# Patient Record
Sex: Male | Born: 1975 | Race: White | Hispanic: No
Health system: Midwestern US, Community
[De-identification: ages and names within clinical notes are randomized; demographics above are authoritative.]

## PROBLEM LIST (undated history)

## (undated) DIAGNOSIS — R748 Abnormal levels of other serum enzymes: Secondary | ICD-10-CM

---

## 2011-07-26 MED ORDER — HYOSCYAMINE SULFATE ER 0.375 MG PO TB12
375 MCG | ORAL_TABLET | ORAL | Status: DC
Start: 2011-07-26 — End: 2016-06-20

## 2011-07-26 MED ORDER — MIRTAZAPINE 15 MG PO TABS
15 MG | ORAL_TABLET | ORAL | Status: DC
Start: 2011-07-26 — End: 2016-06-20

## 2011-07-26 MED ORDER — MELATONIN 3 MG PO TABS
3 MG | ORAL_TABLET | ORAL | Status: DC
Start: 2011-07-26 — End: 2016-06-20

## 2011-07-26 NOTE — Progress Notes (Signed)
Subjective:      Patient ID: Vincent Thornton is a 36 y.o. male, a new patient, who comes in complaining of depression.    HPI He is having trouble sleeping, partly because of the pain in his back and ankles. He is also complaining of diarrhea for several months, on and off. There is no blood. It all seem to start after he had a stomach "flu". His mother has IBS. His pain is no worse with meals. He has pain, a pressure in his left abdomen.      He is 35, single , disabled, with 2 small children, one of whom stays with him most of the time. He is disabled from a crush injury to his ankles and he was on Oxycontin. He is an ex-smoker, but does use dip. He rarely uses alcohol.     He says he is really having trouble falling asleep. He is tired.    Vincent Thornton is a 36 y.o. male with the following history as recorded in [facility]:  Patient Active Problem List    Diagnosis Date Noted   . Anxiety      Current Outpatient Prescriptions   Medication Sig Dispense Refill   . mirtazapine (REMERON) 15 MG tablet 1 tab po q HS for sleep and depression  30 tablet  1   . Melatonin (RA MELATONIN) 3 MG TABS 1 tab po q HS for sleep  100 tablet  3   . hyoscyamine (LEVBID) 0.375 MG CR tablet 1 tab po BID for IBS  60 tablet  3     No current facility-administered medications for this visit.     Allergies: Review of patient's allergies indicates no known allergies.  Past Medical History   Diagnosis Date   . Anxiety      Past Surgical History   Procedure Laterality Date   . Ankle surgery Bilateral      History reviewed. No pertinent family history.  History   Substance Use Topics   . Smoking status: Former Smoker -- 1.00 packs/day     Quit date: 06/25/2011   . Smokeless tobacco: Current User   . Alcohol Use: Yes      occ         Review of Systems   Constitutional: Positive for fatigue.   HENT: Negative.    Eyes: Negative.    Respiratory: Negative.    Cardiovascular: Negative.    Gastrointestinal: Negative.    Genitourinary: Negative for  dysuria.   Musculoskeletal: Positive for back pain.   Neurological: Negative for tremors and weakness.   Psychiatric/Behavioral: Positive for dysphoric mood. The patient is nervous/anxious.        Objective:   Physical Exam   Vitals reviewed.  Constitutional: He is oriented to person, place, and time. He appears well-developed and well-nourished.   HENT:   Head: Normocephalic.   Right Ear: Tympanic membrane, external ear and ear canal normal.   Left Ear: Tympanic membrane, external ear and ear canal normal.   Mouth/Throat: Oropharynx is clear and moist.   Eyes: Conjunctivae and EOM are normal. Pupils are equal, round, and reactive to light.   Neck: Normal range of motion. Neck supple. No JVD present. Carotid bruit is not present. No thyromegaly present.   Cardiovascular: Normal rate, regular rhythm, normal heart sounds and intact distal pulses.    No murmur heard.  Pulmonary/Chest: Effort normal and breath sounds normal. No respiratory distress.   Abdominal: Soft. Bowel sounds are normal. There is no  tenderness.   Musculoskeletal: Normal range of motion. He exhibits no edema.   Lymphadenopathy:     He has no cervical adenopathy.   Neurological: He is alert and oriented to person, place, and time. He has normal reflexes.   Skin: Skin is warm, dry and intact.   Psychiatric: He has a normal mood and affect. His speech is normal and behavior is normal. Judgment and thought content normal. Cognition and memory are normal.   BP 130/86  Pulse 68  Temp(Src) 97.9 F (36.6 C) (Oral)  Resp 16  Wt 179 lb 3.2 oz (81.285 kg)      Assessment:      1. Insomnia    2. Anxiety    3. IBS (irritable bowel syndrome)            Plan:      MEDICATIONS:  Orders Placed This Encounter   Medications   . mirtazapine (REMERON) 15 MG tablet     Sig: 1 tab po q HS for sleep and depression     Dispense:  30 tablet     Refill:  1   . Melatonin (RA MELATONIN) 3 MG TABS     Sig: 1 tab po q HS for sleep     Dispense:  100 tablet     Refill:  3      Whatever amount there is in an OTC bottle   . hyoscyamine (LEVBID) 0.375 MG CR tablet     Sig: 1 tab po BID for IBS     Dispense:  60 tablet     Refill:  3       ORDERS:  No orders of the defined types were placed in this encounter.       Follow-up:  Return in about 4 weeks (around 08/23/2011) for recheck of sleep and stomach.    PATIENT INSTRUCTIONS:  Patient Instructions         We are committed to providing you with the best care possible.   In order to help [place] achieve these goals please remember to bring all medications, herbal products, and over the counter supplements with you to each visit.     If your provider has ordered testing for you, please be sure to follow up with our office if you have not received results within 7 days after the testing took place.     *If you receive a survey after visiting one of our offices, please take time to share your experience concerning your physician office visit. These surveys are confidential and no health information about you is shared.  We are eager to improve for you and we are counting on your feedback to help make that happen.              Insomnia: After Your Visit  Your Care Instructions  Insomnia is the inability to sleep well. It is a common problem for most people at some time. Insomnia may make it hard for you to get to sleep, stay asleep, or sleep as long as you need to. This can make you tired and grouchy during the day. It can also make you forgetful, less effective at work, and unhappy.  Insomnia can be caused by conditions such as depression or anxiety. Pain can also affect your ability to sleep. When these problems are solved, the insomnia usually clears up. But sometimes bad sleep habits can cause insomnia.  If insomnia is affecting your work or your enjoyment of life, you can  take steps to improve your sleep.  Follow-up care is a key part of your treatment and safety. Be sure to make and go to all appointments, and call your doctor if you are having  problems. It's also a good idea to know your test results and keep a list of the medicines you take.  How can you care for yourself at home?  What to avoid   Do not have drinks with caffeine, such as coffee or black tea, for 8 hours before bed.   Do not smoke or use other types of tobacco near bedtime. Nicotine is a stimulant and can keep you awake.   Avoid drinking alcohol late in the evening, because it can cause you to wake in the middle of the night.   Do not eat a big meal close to bedtime. If you are hungry, eat a light snack.   Do not drink a lot of water close to bedtime, because the need to urinate may wake you up during the night.   Do not read or watch TV in bed. Use the bed only for sleeping and sexual activity.  What to try   Go to bed at the same time every night, and wake up at the same time every morning. Do not take naps during the day.   Keep your bedroom quiet, dark, and cool.   Get regular exercise, but not within 3 to 4 hours of your bedtime.   Sleep on a comfortable pillow and mattress.   If watching the clock makes you anxious, turn it facing away from you so you cannot see the time.   If you worry when you lie down, start a worry book. Well before bedtime, write down your worries, and then set the book and your concerns aside.   Try meditation or other relaxation techniques before you go to bed.   If you cannot fall asleep, get up and go to another room until you feel sleepy. Do something relaxing. Repeat your bedtime routine before you go to bed again.   Make your house quiet and calm about an hour before bedtime. Turn down the lights, turn off the TV, log off the computer, and turn down the volume on music. This can help you relax after a busy day.  When should you call for help?  Watch closely for changes in your health, and be sure to contact your doctor if:   Your efforts to improve your sleep do not work.   Your insomnia gets worse.   You have been feeling down,  depressed, or hopeless or have lost interest in things that you usually enjoy.    Where can you learn more?    Go to [email or web address] and sign in to your MyChart account.    Enter P513 in the Search Health Information box to learn more about "Insomnia: After Your Visit."    If you do not have an account, please click on the "Sign Up Now" link.     2006-2012 [employer]. Care instructions adapted under license by [facility]. This care instruction is for use with your licensed healthcare professional. If you have questions about a medical condition or this instruction, always ask your healthcare professional. [employer] disclaims any warranty or liability for your use of this information.  Content Version: 9.4.94723; Last Revised: May 17, 2010                Irritable Bowel Syndrome: After Your Visit  Your  Care Instructions  Irritable bowel syndrome, or IBS, is a problem with the intestines that causes belly pain, bloating, gas, constipation, and diarrhea. The cause of IBS is not well known. IBS can last for many years, but it does not get worse over time or lead to serious disease.  Most people can control their symptoms by changing their diet and reducing stress.  Follow-up care is a key part of your treatment and safety. Be sure to make and go to all appointments, and call your doctor if you are having problems. It's also a good idea to know your test results and keep a list of the medicines you take.  How can you care for yourself at home?   For constipation:   Include fruits, vegetables, beans, and whole grains in your diet each day. These foods are high in fiber.   Drink plenty of fluids, enough so that your urine is light yellow or clear like water. If you have kidney, heart, or liver disease and have to limit fluids, talk with your doctor before you increase the amount of fluids you drink.   Get some exercise every day. Build up slowly to  30 to 60 minutes a day on 5 or more days of the week.   Take a fiber supplement, such as Citrucel or Metamucil, every day if needed. Start with a small dose and very slowly increase the dose over a month or more.   Schedule time each day for a bowel movement. Having a daily routine may help. Take your time and do not strain when having a bowel movement.   If you often have diarrhea, limit foods and drinks that make it worse. These are different for each person but may include caffeine (found in coffee, tea, chocolate, and cola drinks), alcohol, fatty foods, gas-producing foods (such as beans, cabbage, and broccoli), some dairy products, and spicy foods. Do not eat candy or gum that contains sorbitol.   Keep a daily diary of what you eat and what symptoms you have. This may help find foods that cause you problems.   Eat slowly. Try to make mealtime relaxing.   Find ways to reduce stress.   Get at least 30 minutes of exercise on most days of the week. Exercise can help reduce tension and prevent constipation. Walking is a good choice. You also may want to do other activities, such as running, swimming, cycling, or playing tennis or team sports.  When should you call for help?  Call your doctor now or seek immediate medical care if:   Your pain is different than usual or occurs with fever.   You lose weight without trying, or you lose your appetite and you do not know why.   Your symptoms often wake you from sleep.   Your stools are black and tarlike or have streaks of blood.  Watch closely for changes in your health, and be sure to contact your doctor if:   Your IBS symptoms get worse or begin to disrupt your day-to-day life.   You become more tired than usual.   Your home treatment stops working.    Where can you learn more?    Go to [email or web address] and sign in to your MyChart account.    Enter [phone] in the Search Health Information box to learn more about "Irritable Bowel  Syndrome: After Your Visit."    If you do not have an account, please click on the "Sign Up Now" link.  2006-2012 [employer]. Care instructions adapted under license by [facility]. This care instruction is for use with your licensed healthcare professional. If you have questions about a medical condition or this instruction, always ask your healthcare professional. [employer] disclaims any warranty or liability for your use of this information.  Content Version: 9.4.94723; Last Revised: August 30, 2009

## 2011-07-26 NOTE — Patient Instructions (Signed)
We are committed to providing you with the best care possible.   In order to help [place] achieve these goals please remember to bring all medications, herbal products, and over the counter supplements with you to each visit.     If your provider has ordered testing for you, please be sure to follow up with our office if you have not received results within 7 days after the testing took place.     *If you receive a survey after visiting one of our offices, please take time to share your experience concerning your physician office visit. These surveys are confidential and no health information about you is shared.  We are eager to improve for you and we are counting on your feedback to help make that happen.              Insomnia: After Your Visit  Your Care Instructions  Insomnia is the inability to sleep well. It is a common problem for most people at some time. Insomnia may make it hard for you to get to sleep, stay asleep, or sleep as long as you need to. This can make you tired and grouchy during the day. It can also make you forgetful, less effective at work, and unhappy.  Insomnia can be caused by conditions such as depression or anxiety. Pain can also affect your ability to sleep. When these problems are solved, the insomnia usually clears up. But sometimes bad sleep habits can cause insomnia.  If insomnia is affecting your work or your enjoyment of life, you can take steps to improve your sleep.  Follow-up care is a key part of your treatment and safety. Be sure to make and go to all appointments, and call your doctor if you are having problems. It's also a good idea to know your test results and keep a list of the medicines you take.  How can you care for yourself at home?  What to avoid   Do not have drinks with caffeine, such as coffee or black tea, for 8 hours before bed.   Do not smoke or use other types of tobacco near bedtime. Nicotine is a stimulant and can keep you awake.   Avoid drinking alcohol  late in the evening, because it can cause you to wake in the middle of the night.   Do not eat a big meal close to bedtime. If you are hungry, eat a light snack.   Do not drink a lot of water close to bedtime, because the need to urinate may wake you up during the night.   Do not read or watch TV in bed. Use the bed only for sleeping and sexual activity.  What to try   Go to bed at the same time every night, and wake up at the same time every morning. Do not take naps during the day.   Keep your bedroom quiet, dark, and cool.   Get regular exercise, but not within 3 to 4 hours of your bedtime.   Sleep on a comfortable pillow and mattress.   If watching the clock makes you anxious, turn it facing away from you so you cannot see the time.   If you worry when you lie down, start a worry book. Well before bedtime, write down your worries, and then set the book and your concerns aside.   Try meditation or other relaxation techniques before you go to bed.   If you cannot fall asleep, get up and go  to another room until you feel sleepy. Do something relaxing. Repeat your bedtime routine before you go to bed again.   Make your house quiet and calm about an hour before bedtime. Turn down the lights, turn off the TV, log off the computer, and turn down the volume on music. This can help you relax after a busy day.  When should you call for help?  Watch closely for changes in your health, and be sure to contact your doctor if:   Your efforts to improve your sleep do not work.   Your insomnia gets worse.   You have been feeling down, depressed, or hopeless or have lost interest in things that you usually enjoy.    Where can you learn more?    Go to [email or web address] and sign in to your MyChart account.    Enter P513 in the Search Health Information box to learn more about "Insomnia: After Your Visit."    If you do not have an account, please click on the "Sign Up Now" link.     2006-2012  [employer]. Care instructions adapted under license by [facility]. This care instruction is for use with your licensed healthcare professional. If you have questions about a medical condition or this instruction, always ask your healthcare professional. [employer] disclaims any warranty or liability for your use of this information.  Content Version: 9.4.94723; Last Revised: May 17, 2010                Irritable Bowel Syndrome: After Your Visit  Your Care Instructions  Irritable bowel syndrome, or IBS, is a problem with the intestines that causes belly pain, bloating, gas, constipation, and diarrhea. The cause of IBS is not well known. IBS can last for many years, but it does not get worse over time or lead to serious disease.  Most people can control their symptoms by changing their diet and reducing stress.  Follow-up care is a key part of your treatment and safety. Be sure to make and go to all appointments, and call your doctor if you are having problems. It's also a good idea to know your test results and keep a list of the medicines you take.  How can you care for yourself at home?   For constipation:   Include fruits, vegetables, beans, and whole grains in your diet each day. These foods are high in fiber.   Drink plenty of fluids, enough so that your urine is light yellow or clear like water. If you have kidney, heart, or liver disease and have to limit fluids, talk with your doctor before you increase the amount of fluids you drink.   Get some exercise every day. Build up slowly to 30 to 60 minutes a day on 5 or more days of the week.   Take a fiber supplement, such as Citrucel or Metamucil, every day if needed. Start with a small dose and very slowly increase the dose over a month or more.   Schedule time each day for a bowel movement. Having a daily routine may help. Take your time and do not strain when having a bowel movement.   If you often have  diarrhea, limit foods and drinks that make it worse. These are different for each person but may include caffeine (found in coffee, tea, chocolate, and cola drinks), alcohol, fatty foods, gas-producing foods (such as beans, cabbage, and broccoli), some dairy products, and spicy foods. Do not eat candy or gum that  contains sorbitol.   Keep a daily diary of what you eat and what symptoms you have. This may help find foods that cause you problems.   Eat slowly. Try to make mealtime relaxing.   Find ways to reduce stress.   Get at least 30 minutes of exercise on most days of the week. Exercise can help reduce tension and prevent constipation. Walking is a good choice. You also may want to do other activities, such as running, swimming, cycling, or playing tennis or team sports.  When should you call for help?  Call your doctor now or seek immediate medical care if:   Your pain is different than usual or occurs with fever.   You lose weight without trying, or you lose your appetite and you do not know why.   Your symptoms often wake you from sleep.   Your stools are black and tarlike or have streaks of blood.  Watch closely for changes in your health, and be sure to contact your doctor if:   Your IBS symptoms get worse or begin to disrupt your day-to-day life.   You become more tired than usual.   Your home treatment stops working.    Where can you learn more?    Go to [email or web address] and sign in to your MyChart account.    Enter [phone] in the Search Health Information box to learn more about "Irritable Bowel Syndrome: After Your Visit."    If you do not have an account, please click on the "Sign Up Now" link.     2006-2012 [employer]. Care instructions adapted under license by [facility]. This care instruction is for use with your licensed healthcare professional. If you have questions about a medical condition or this instruction, always ask your healthcare  professional. [employer] disclaims any warranty or liability for your use of this information.  Content Version: 9.4.94723; Last Revised: August 30, 2009

## 2016-02-05 ENCOUNTER — Observation Stay (HOSPITAL_COMMUNITY)
Admission: EM | Admit: 2016-02-05 | Discharge: 2016-02-06 | Disposition: A | Discharge status: Home / Self Care | Payer: 59 | Attending: General Surgery | Admitting: General Surgery

## 2016-02-05 ENCOUNTER — Emergency Department (HOSPITAL_COMMUNITY): Payer: 59 | Admitting: Certified Registered Nurse Anesthetist

## 2016-02-05 ENCOUNTER — Encounter (HOSPITAL_COMMUNITY)
Admission: EM | Disposition: A | Discharge status: Home / Self Care | Payer: Self-pay | Source: Home / Self Care | Attending: Emergency Medicine

## 2016-02-05 ENCOUNTER — Emergency Department (HOSPITAL_COMMUNITY): Payer: 59

## 2016-02-05 ENCOUNTER — Encounter (HOSPITAL_COMMUNITY): Payer: Self-pay

## 2016-02-05 DIAGNOSIS — S1191XA Laceration without foreign body of unspecified part of neck, initial encounter: Secondary | ICD-10-CM | POA: Diagnosis not present

## 2016-02-05 DIAGNOSIS — S199XXA Unspecified injury of neck, initial encounter: Secondary | ICD-10-CM

## 2016-02-05 DIAGNOSIS — W293XXA Contact with powered garden and outdoor hand tools and machinery, initial encounter: Secondary | ICD-10-CM | POA: Insufficient documentation

## 2016-02-05 DIAGNOSIS — S21119A Laceration without foreign body of unspecified front wall of thorax without penetration into thoracic cavity, initial encounter: Secondary | ICD-10-CM

## 2016-02-05 DIAGNOSIS — F17200 Nicotine dependence, unspecified, uncomplicated: Secondary | ICD-10-CM | POA: Insufficient documentation

## 2016-02-05 DIAGNOSIS — S2191XA Laceration without foreign body of unspecified part of thorax, initial encounter: Secondary | ICD-10-CM | POA: Diagnosis present

## 2016-02-05 HISTORY — PX: I & D EXTREMITY: SHX5045

## 2016-02-05 LAB — COMPREHENSIVE METABOLIC PANEL
ALK PHOS: 57 U/L (ref 38–126)
ALT: 18 U/L (ref 17–63)
ANION GAP: 7 (ref 5–15)
AST: 26 U/L (ref 15–41)
Albumin: 4.2 g/dL (ref 3.5–5.0)
BILIRUBIN TOTAL: 1.3 mg/dL — AB (ref 0.3–1.2)
BUN: 15 mg/dL (ref 6–20)
CALCIUM: 9.3 mg/dL (ref 8.9–10.3)
CO2: 23 mmol/L (ref 22–32)
Chloride: 108 mmol/L (ref 101–111)
Creatinine, Ser: 1.04 mg/dL (ref 0.61–1.24)
Glucose, Bld: 98 mg/dL (ref 65–99)
Potassium: 3.8 mmol/L (ref 3.5–5.1)
Sodium: 138 mmol/L (ref 135–145)
TOTAL PROTEIN: 7.3 g/dL (ref 6.5–8.1)

## 2016-02-05 LAB — URINALYSIS, ROUTINE W REFLEX MICROSCOPIC
Bilirubin Urine: NEGATIVE
Glucose, UA: NEGATIVE mg/dL
HGB URINE DIPSTICK: NEGATIVE
Ketones, ur: NEGATIVE mg/dL
Leukocytes, UA: NEGATIVE
NITRITE: NEGATIVE
PROTEIN: NEGATIVE mg/dL
Specific Gravity, Urine: >1.046 — ABNORMAL HIGH (ref 1.005–1.030)
pH: 6 (ref 5.0–8.0)

## 2016-02-05 LAB — PREPARE FRESH FROZEN PLASMA
Unit division: 0
Unit division: 0

## 2016-02-05 LAB — TYPE AND SCREEN
ABO/RH(D): A POS
Antibody Screen: NEGATIVE
UNIT DIVISION: 0
Unit division: 0

## 2016-02-05 LAB — I-STAT CHEM 8, ED
BUN: 18 mg/dL (ref 6–20)
CALCIUM ION: 1.11 mmol/L — AB (ref 1.13–1.30)
CHLORIDE: 104 mmol/L (ref 101–111)
Creatinine, Ser: 0.9 mg/dL (ref 0.61–1.24)
GLUCOSE: 95 mg/dL (ref 65–99)
HCT: 45 % (ref 39.0–52.0)
Hemoglobin: 15.3 g/dL (ref 13.0–17.0)
Potassium: 3.9 mmol/L (ref 3.5–5.1)
Sodium: 142 mmol/L (ref 135–145)
TCO2: 23 mmol/L (ref 0–100)

## 2016-02-05 LAB — CBC
HCT: 43.9 % (ref 39.0–52.0)
HEMOGLOBIN: 14.5 g/dL (ref 13.0–17.0)
MCH: 30.7 pg (ref 26.0–34.0)
MCHC: 33 g/dL (ref 30.0–36.0)
MCV: 93 fL (ref 78.0–100.0)
Platelets: 214 10*3/uL (ref 150–400)
RBC: 4.72 MIL/uL (ref 4.22–5.81)
RDW: 12.1 % (ref 11.5–15.5)
WBC: 6.3 10*3/uL (ref 4.0–10.5)

## 2016-02-05 LAB — PROTIME-INR
INR: 1.01
PROTHROMBIN TIME: 13.3 s (ref 11.4–15.2)

## 2016-02-05 LAB — CDS SEROLOGY

## 2016-02-05 LAB — ETHANOL

## 2016-02-05 LAB — I-STAT CG4 LACTIC ACID, ED: LACTIC ACID, VENOUS: 1.05 mmol/L (ref 0.5–1.9)

## 2016-02-05 LAB — ABO/RH: ABO/RH(D): A POS

## 2016-02-05 SURGERY — IRRIGATION AND DEBRIDEMENT EXTREMITY
Anesthesia: General | Laterality: Left

## 2016-02-05 MED ORDER — SUCCINYLCHOLINE CHLORIDE 200 MG/10ML IV SOSY
PREFILLED_SYRINGE | INTRAVENOUS | Status: AC
  Filled 2016-02-05: qty 10

## 2016-02-05 MED ORDER — ONDANSETRON HCL 4 MG/2ML IJ SOLN
INTRAMUSCULAR | Status: AC
  Filled 2016-02-05: qty 2

## 2016-02-05 MED ORDER — SUGAMMADEX SODIUM 200 MG/2ML IV SOLN
INTRAVENOUS | Status: AC
  Filled 2016-02-05: qty 2

## 2016-02-05 MED ORDER — ENOXAPARIN SODIUM 40 MG/0.4ML ~~LOC~~ SOLN
40.0000 mg | SUBCUTANEOUS | Status: DC
  Administered 2016-02-05: 40 mg via SUBCUTANEOUS
  Filled 2016-02-05: qty 0.4

## 2016-02-05 MED ORDER — MIDAZOLAM HCL 5 MG/5ML IJ SOLN
INTRAMUSCULAR | Status: DC | PRN
  Administered 2016-02-05: 2 mg via INTRAVENOUS

## 2016-02-05 MED ORDER — 0.9 % SODIUM CHLORIDE (POUR BTL) OPTIME
TOPICAL | Status: DC | PRN
  Administered 2016-02-05: 1000 mL

## 2016-02-05 MED ORDER — MEPERIDINE HCL 25 MG/ML IJ SOLN
6.2500 mg | INTRAMUSCULAR | Status: DC | PRN
Start: 2016-02-05 — End: 2016-02-06

## 2016-02-05 MED ORDER — PROPOFOL 10 MG/ML IV BOLUS
INTRAVENOUS | Status: DC | PRN
  Administered 2016-02-05: 160 mg via INTRAVENOUS

## 2016-02-05 MED ORDER — LIDOCAINE 2% (20 MG/ML) 5 ML SYRINGE
INTRAMUSCULAR | Status: DC | PRN
  Administered 2016-02-05: 100 mg via INTRAVENOUS

## 2016-02-05 MED ORDER — LACTATED RINGERS IV SOLN
INTRAVENOUS | Status: DC | PRN
  Administered 2016-02-05: 10:00:00 via INTRAVENOUS

## 2016-02-05 MED ORDER — HYDROMORPHONE HCL 1 MG/ML IJ SOLN: 0.2500 mg | INTRAMUSCULAR | Status: DC | PRN

## 2016-02-05 MED ORDER — TETANUS-DIPHTH-ACELL PERTUSSIS 5-2.5-18.5 LF-MCG/0.5 IM SUSP
0.5000 mL | Freq: Once | INTRAMUSCULAR | Status: AC
  Administered 2016-02-05: 0.5 mL via INTRAMUSCULAR
  Filled 2016-02-05 (×2): qty 0.5

## 2016-02-05 MED ORDER — PROPOFOL 10 MG/ML IV BOLUS
INTRAVENOUS | Status: AC
  Filled 2016-02-05: qty 20

## 2016-02-05 MED ORDER — KETOROLAC TROMETHAMINE 30 MG/ML IJ SOLN
INTRAMUSCULAR | Status: AC
  Filled 2016-02-05: qty 1

## 2016-02-05 MED ORDER — FENTANYL CITRATE (PF) 100 MCG/2ML IJ SOLN
INTRAMUSCULAR | Status: AC
  Filled 2016-02-05: qty 2

## 2016-02-05 MED ORDER — NAPROXEN 250 MG PO TABS
500.0000 mg | ORAL_TABLET | Freq: Two times a day (BID) | ORAL | Status: DC
  Administered 2016-02-05 – 2016-02-06 (×3): 500 mg via ORAL
  Filled 2016-02-05 (×3): qty 2

## 2016-02-05 MED ORDER — CEFAZOLIN SODIUM-DEXTROSE 2-4 GM/100ML-% IV SOLN
2.0000 g | Freq: Once | INTRAVENOUS | Status: AC
  Administered 2016-02-05: 2 g via INTRAVENOUS
  Filled 2016-02-05: qty 100

## 2016-02-05 MED ORDER — ONDANSETRON HCL 4 MG PO TABS
4.0000 mg | ORAL_TABLET | Freq: Four times a day (QID) | ORAL | Status: DC | PRN
Start: 2016-02-05 — End: 2016-02-06

## 2016-02-05 MED ORDER — ROCURONIUM BROMIDE 10 MG/ML (PF) SYRINGE
PREFILLED_SYRINGE | INTRAVENOUS | Status: DC | PRN
  Administered 2016-02-05: 40 mg via INTRAVENOUS

## 2016-02-05 MED ORDER — ONDANSETRON HCL 4 MG/2ML IJ SOLN: 4.0000 mg | Freq: Four times a day (QID) | INTRAMUSCULAR | Status: DC | PRN

## 2016-02-05 MED ORDER — SUCCINYLCHOLINE CHLORIDE 200 MG/10ML IV SOSY
PREFILLED_SYRINGE | INTRAVENOUS | Status: DC | PRN
  Administered 2016-02-05: 120 mg via INTRAVENOUS

## 2016-02-05 MED ORDER — LIDOCAINE 2% (20 MG/ML) 5 ML SYRINGE
INTRAMUSCULAR | Status: AC
  Filled 2016-02-05: qty 5

## 2016-02-05 MED ORDER — FENTANYL CITRATE (PF) 100 MCG/2ML IJ SOLN
INTRAMUSCULAR | Status: DC | PRN
  Administered 2016-02-05: 100 ug via INTRAVENOUS

## 2016-02-05 MED ORDER — MIDAZOLAM HCL 2 MG/2ML IJ SOLN
INTRAMUSCULAR | Status: AC
  Filled 2016-02-05: qty 2

## 2016-02-05 MED ORDER — ONDANSETRON HCL 4 MG/2ML IJ SOLN
INTRAMUSCULAR | Status: DC | PRN
  Administered 2016-02-05: 4 mg via INTRAVENOUS

## 2016-02-05 MED ORDER — SUGAMMADEX SODIUM 200 MG/2ML IV SOLN
INTRAVENOUS | Status: DC | PRN
  Administered 2016-02-05: 200 mg via INTRAVENOUS

## 2016-02-05 MED ORDER — TRAMADOL HCL 50 MG PO TABS
ORAL_TABLET | ORAL | Status: AC
  Filled 2016-02-05: qty 2

## 2016-02-05 MED ORDER — IOPAMIDOL (ISOVUE-370) INJECTION 76%
100.0000 mL | Freq: Once | INTRAVENOUS | Status: AC | PRN
  Administered 2016-02-05: 100 mL via INTRAVENOUS

## 2016-02-05 MED ORDER — PROMETHAZINE HCL 25 MG/ML IJ SOLN: 6.2500 mg | INTRAMUSCULAR | Status: DC | PRN

## 2016-02-05 MED ORDER — ROCURONIUM BROMIDE 10 MG/ML (PF) SYRINGE
PREFILLED_SYRINGE | INTRAVENOUS | Status: AC
  Filled 2016-02-05: qty 10

## 2016-02-05 MED ORDER — ACETAMINOPHEN 325 MG PO TABS: 650.0000 mg | ORAL_TABLET | Freq: Four times a day (QID) | ORAL | Status: DC | PRN

## 2016-02-05 MED ORDER — TRAMADOL HCL 50 MG PO TABS
50.0000 mg | ORAL_TABLET | Freq: Four times a day (QID) | ORAL | Status: DC | PRN
  Administered 2016-02-05: 100 mg via ORAL

## 2016-02-05 MED ORDER — KETOROLAC TROMETHAMINE 30 MG/ML IJ SOLN: 30.0000 mg | Freq: Four times a day (QID) | INTRAMUSCULAR | Status: DC | PRN

## 2016-02-05 SURGICAL SUPPLY — 28 items
CANISTER SUCTION 2500CC (MISCELLANEOUS) ×3 IMPLANT
COVER SURGICAL LIGHT HANDLE (MISCELLANEOUS) ×3 IMPLANT
DRSG PAD ABDOMINAL 8X10 ST (GAUZE/BANDAGES/DRESSINGS) IMPLANT
ELECT CAUTERY BLADE 6.4 (BLADE) ×3 IMPLANT
ELECT REM PT RETURN 9FT ADLT (ELECTROSURGICAL) ×3
ELECTRODE REM PT RTRN 9FT ADLT (ELECTROSURGICAL) ×1 IMPLANT
GAUZE SPONGE 4X4 12PLY STRL (GAUZE/BANDAGES/DRESSINGS) IMPLANT
GLOVE BIOGEL PI IND STRL 7.0 (GLOVE) ×2 IMPLANT
GLOVE BIOGEL PI IND STRL 7.5 (GLOVE) ×1 IMPLANT
GLOVE BIOGEL PI INDICATOR 7.0 (GLOVE) ×4
GLOVE BIOGEL PI INDICATOR 7.5 (GLOVE) ×2
GLOVE SURG SS PI 7.0 STRL IVOR (GLOVE) ×6 IMPLANT
GOWN STRL REUS W/ TWL LRG LVL3 (GOWN DISPOSABLE) ×2 IMPLANT
GOWN STRL REUS W/TWL LRG LVL3 (GOWN DISPOSABLE) ×4
KIT BASIN OR (CUSTOM PROCEDURE TRAY) ×3 IMPLANT
KIT ROOM TURNOVER OR (KITS) ×3 IMPLANT
NS IRRIG 1000ML POUR BTL (IV SOLUTION) ×3 IMPLANT
PACK SURGICAL SETUP 50X90 (CUSTOM PROCEDURE TRAY) ×3 IMPLANT
PAD ARMBOARD 7.5X6 YLW CONV (MISCELLANEOUS) ×3 IMPLANT
PENCIL BUTTON HOLSTER BLD 10FT (ELECTRODE) ×3 IMPLANT
STAPLER VISISTAT 35W (STAPLE) ×3 IMPLANT
SUT VIC AB 2-0 SH 27 (SUTURE) ×6
SUT VIC AB 2-0 SH 27XBRD (SUTURE) ×3 IMPLANT
TOWEL OR 17X24 6PK STRL BLUE (TOWEL DISPOSABLE) ×3 IMPLANT
TOWEL OR 17X26 10 PK STRL BLUE (TOWEL DISPOSABLE) ×3 IMPLANT
TUBE CONNECTING 12'X1/4 (SUCTIONS) ×1
TUBE CONNECTING 12X1/4 (SUCTIONS) ×2 IMPLANT
YANKAUER SUCT BULB TIP NO VENT (SUCTIONS) ×3 IMPLANT

## 2016-02-05 NOTE — ED Notes (Signed)
Accompanied pt to CT 

## 2016-02-05 NOTE — Anesthesia Postprocedure Evaluation (Signed)
Anesthesia Post Note  Patient: Devin Cortez  Procedure(s) Performed: Procedure(s) (LRB): IRRIGATION AND DEBRIDEMENT UPPER CHEST (Left)  Patient location during evaluation: PACU Anesthesia Type: General Level of consciousness: sedated and patient cooperative Pain management: pain level controlled Vital Signs Assessment: post-procedure vital signs reviewed and stable Respiratory status: spontaneous breathing Cardiovascular status: stable Anesthetic complications: no    Last Vitals:  Vitals:   02/05/16 1240 02/05/16 1321  BP: 130/80 130/85  Pulse: (!) 54 (!) 55  Resp:    Temp: 36.6 C 36.6 C    Last Pain:  Vitals:   02/05/16 1321  TempSrc: Oral  PainSc:                  Lewie LoronJohn Otis Portal

## 2016-02-05 NOTE — Anesthesia Procedure Notes (Signed)
Procedure Name: Intubation Date/Time: 02/05/2016 10:31 AM Performed by: Charm BargesBUTLER, Sofya Moustafa R Pre-anesthesia Checklist: Patient identified, Emergency Drugs available, Suction available and Patient being monitored Patient Re-evaluated:Patient Re-evaluated prior to inductionOxygen Delivery Method: Circle System Utilized Preoxygenation: Pre-oxygenation with 100% oxygen Intubation Type: IV induction, Rapid sequence and Cricoid Pressure applied Laryngoscope Size: Mac and 4 Grade View: Grade II Tube type: Oral Tube size: 8.0 mm Number of attempts: 1 Airway Equipment and Method: Stylet Placement Confirmation: ETT inserted through vocal cords under direct vision,  positive ETCO2 and breath sounds checked- equal and bilateral Secured at: 22 cm Tube secured with: Tape Dental Injury: Teeth and Oropharynx as per pre-operative assessment

## 2016-02-05 NOTE — ED Notes (Signed)
Dry abd applied to wound

## 2016-02-05 NOTE — Op Note (Signed)
Preoperative diagnosis: laceration to neck  Postoperative diagnosis: same   Procedure: washout of traumatic laceration, repair of left SCM and complex closure of 20cm neck laceration Surgeon: Feliciana RossettiLuke Kinsinger, M.D.  Asst: none  Anesthesia: general  Indications for procedure: Devin Cortez is a 40 y.o. year old male with large laceration to the upper right chest and neck. Patient was using a chain saw on a pipe hepatic kickback cutting his upper right chest and neck. In the trauma bay patient was stable taken to CT scan which revealed no deep injury. He presents to the operating room for repair of an exploration of laceration  Description of procedure: The patient was brought into the operative suite. Anesthesia was administered with General endotracheal anesthesia. WHO checklist was applied. The patient was then placed in supine. The area was prepped and draped in the usual sterile fashion.  The laceration was irrigated with copious amounts of saline. Entirety was inspected and appeared to be some penetration of the superficial fascia over the right chest as well as platysma over the left neck and 90% of the SCM on the left side. There was no penetration of the investing fascia of the neck and no major vessels severed. I sutured the anterior SCM fascia closed with a 2-0 Vicryl in running fashion. Then sutured the platysma on the left side of the neck with 2-0 Vicryl in running fashion. An suture the superficial fascia of the chest with a 2-0 Vicryl in running fashion. I then loosely approximated the skin with staples. Bandage was put in place for dressing. Patient awoke from anesthesia and brought to PACU in stable condition. All counts are correct.  Findings: 90% separation of left SCM with no investing fascia penetration  Specimen: none  Implant: none   Blood loss: 50ml  Local anesthesia:   Complications: none  Feliciana RossettiLuke Kinsinger, M.D. General, Bariatric, & Minimally Invasive  Sur[facility]

## 2016-02-05 NOTE — Transfer of Care (Signed)
Immediate Anesthesia Transfer of Care Note  Patient: Devin Cortez  Procedure(s) Performed: Procedure(s): IRRIGATION AND DEBRIDEMENT UPPER CHEST (Left)  Patient Location: PACU  Anesthesia Type:General  Level of Consciousness: awake, oriented and patient cooperative  Airway & Oxygen Therapy: Patient Spontanous Breathing and Patient connected to nasal cannula oxygen  Post-op Assessment: Report given to RN, Post -op Vital signs reviewed and stable and Patient moving all extremities  Post vital signs: Reviewed and stable  Last Vitals:  Vitals:   02/05/16 1014 02/05/16 1120  BP: 135/94 (P) 141/85  Pulse: 65 (P) 74  Resp: 24 (P) 14  Temp:  (P) 36.4 C    Last Pain:  Vitals:   02/05/16 0953  TempSrc:   PainSc: 3          Complications: No apparent anesthesia complications

## 2016-02-05 NOTE — Anesthesia Postprocedure Evaluation (Signed)
Anesthesia Post Note  Patient: Devin Cortez  Procedure(s) Performed: Procedure(s) (LRB): IRRIGATION AND DEBRIDEMENT UPPER CHEST (Left)  Patient location during evaluation: PACU Anesthesia Type: General Level of consciousness: sedated and patient cooperative Pain management: pain level controlled Vital Signs Assessment: post-procedure vital signs reviewed and stable Respiratory status: spontaneous breathing Cardiovascular status: stable Anesthetic complications: no    Last Vitals:  Vitals:   02/05/16 1240 02/05/16 1321  BP: 130/80 130/85  Pulse: (!) 54 (!) 55  Resp:    Temp: 36.6 C 36.6 C    Last Pain:  Vitals:   02/05/16 1321  TempSrc: Oral  PainSc:                  Ilze Roselli     

## 2016-02-05 NOTE — Anesthesia Preprocedure Evaluation (Signed)
Anesthesia Evaluation  Patient identified by MRN, date of birth, ID band Patient awake    Reviewed: Allergy & Precautions, NPO status , Patient's Chart, lab work & pertinent test results  Airway Mallampati: II  TM Distance: >3 FB Neck ROM: Full    Dental no notable dental hx.    Pulmonary neg pulmonary ROS, Current Smoker,    Pulmonary exam normal breath sounds clear to auscultation       Cardiovascular negative cardio ROS Normal cardiovascular exam Rhythm:Regular Rate:Normal     Neuro/Psych negative neurological ROS  negative psych ROS   GI/Hepatic negative GI ROS, Neg liver ROS,   Endo/Other  negative endocrine ROS  Renal/GU negative Renal ROS     Musculoskeletal negative musculoskeletal ROS (+)   Abdominal   Peds  Hematology negative hematology ROS (+)   Anesthesia Other Findings   Reproductive/Obstetrics                             Anesthesia Physical Anesthesia Plan  ASA: II  Anesthesia Plan: General   Post-op Pain Management:    Induction: Intravenous  Airway Management Planned: Oral ETT  Additional Equipment:   Intra-op Plan:   Post-operative Plan: Extubation in OR  Informed Consent: I have reviewed the patients History and Physical, chart, labs and discussed the procedure including the risks, benefits and alternatives for the proposed anesthesia with the patient or authorized representative who has indicated his/her understanding and acceptance.   Dental advisory given  Plan Discussed with: CRNA  Anesthesia Plan Comments:         Anesthesia Quick Evaluation

## 2016-02-05 NOTE — ED Triage Notes (Signed)
Pt. Was working with a chop saw and it jumped back and cut his anterior lt. Anterior neck acrossed to his rt. Anterior chest.

## 2016-02-05 NOTE — ED Provider Notes (Signed)
[facility]-EMERGENCY DEPT Provider Note   CSN: [id number] Arrival date & time: 02/05/16  0941     History   Chief Complaint Chief Complaint  Patient presents with  . Laceration    HPI Devin Cortez is a 40 y.o. male.  HPI   40 year old male who presents with chest wall laceration. Working with a chop saw when it backfired against his chest. Sustained a large laceration to the anterior chest wall and inferior portion of the neck. Denies fall or any other injuries. Denies any difficulty breathing or swallowing.  History reviewed. No pertinent past medical history.  There are no active problems to display for this patient.   History reviewed. No pertinent surgical history. Surgical history: bilateral ankle fusion    Home Medications    Prior to Admission medications   Not on File    Family History History reviewed. No pertinent family history.  Social History Social History  Substance Use Topics  . Smoking status: Current Every Day Smoker  . Smokeless tobacco: Never Used  . 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   Review of Systems Review of Systems  Unable to perform ROS: Acuity of condition    10/14 systems reviewed and are negative other than those stated in the HPI  Physical Exam Updated Vital Signs BP 135/94 (BP Location: Right Arm)   Pulse 65   Temp 98.4 F (36.9 C) (Oral)   Resp 24   Ht 5\' 10"  (1.778 m)   Wt 190 lb (86.2 kg)   SpO2 97%   BMI 27.26 kg/m   Physical Exam Physical Exam  Nursing note and vitals reviewed. Constitutional: Well developed, well nourished, non-toxic, and in no acute distress Head: Normocephalic and atraumatic.  Mouth/Throat: Oropharynx is clear and moist.  Neck: Normal range of motion. Neck supple.  Cardiovascular: Normal rate and regular rhythm.   +2 bilateral radial pulses Pulmonary/Chest: Effort normal and breath sounds  normal. There is deep 8 inch laceration across the superior anterior chest pain up to the base of the neck. No active bleeding, but visible pulsations of left carotid artery noted. With exposure of the sternum and left SCM muscle. Abdominal: Soft. There is no tenderness. There is no rebound and no guarding.  Musculoskeletal: Normal range of motion. no TLS spine tenderness. Neurological: Alert, no facial droop, fluent speech, moves all extremities symmetrically Skin: Skin is warm and dry.  Psychiatric: Cooperative   ED Treatments / Results  Labs (all labs ordered are listed, but only abnormal results are displayed) Labs Reviewed  I-STAT CHEM 8, ED - Abnormal; Notable for the following:       Result Value   Calcium, Ion 1.11 (*)    All other components within normal limits  CDS SEROLOGY  COMPREHENSIVE METABOLIC PANEL  CBC  ETHANOL  URINALYSIS, ROUTINE W REFLEX MICROSCOPIC (NOT AT [facility])  PROTIME-INR  I-STAT CG4 LACTIC ACID, ED  TYPE AND SCREEN  PREPARE FRESH FROZEN PLASMA    EKG  EKG Interpretation None       Radiology Dg Chest Port 1 View  Result Date: 02/05/2016 CLINICAL DATA:  Level 1 trauma - chainsaw laceration across the chest - image not repeated per Dr. Leotis Shames; pt having a CT chest also EXAM: PORTABLE CHEST 1 VIEW FINDINGS: Normal cardiac silhouette. No pulmonary contusion or pleural fluid. No pneumothorax. No evidence of fracture. Small a gas in  the soft tissue of the LEFT neck. IMPRESSION: No radiographic evidence of thoracic trauma. Small amount gas in soft tissue LEFT neck. Electronically Signed   By: Genevive BiStewart  Edmunds M.D.   On: 02/05/2016 10:07    Procedures Procedures (including critical care time)  Medications Ordered in ED Medications  ceFAZolin (ANCEF) IVPB 2g/100 mL premix (2 g Intravenous New Bag/Given 02/05/16 1013)     Initial Impression / Assessment and Plan / ED Course  I have reviewed the triage vital signs and the nursing notes.  Pertinent  labs & imaging results that were available during my care of the patient were reviewed by me and considered in my medical decision making (see chart for details).  Clinical Course  Level I trauma activated on patient arrival. He is hemodynamically stable, protecting his airway and with normal breathing. Large 8 inch deep laceration across the anterior superior chest wall with exposure to bone up to the inferior aspect of the anterior neck. There is muscle exposure, no active bleeding or pulsatile bleeding but pulsations from the carotid are clearly visible. Trauma surgery present for evaluation. Plan for CTA of the neck and chest for evaluation of vascular injury. Patient will subsequently be taken to the OR by Dr. Drexel IhaKissinger for washout  CRITICAL CARE Performed by: Lavera Guiseana Duo Mansfield Dann   Total critical care time: 35 minutes  Critical care time was exclusive of separately billable procedures and treating other patients.  Critical care was necessary to treat or prevent imminent or life-threatening deterioration.  Critical care was time spent personally by me on the following activities: development of treatment plan with patient and/or surrogate as well as nursing, discussions with consultants, evaluation of patient's response to treatment, examination of patient, obtaining history from patient or surrogate, ordering and performing treatments and interventions, ordering and review of laboratory studies, ordering and review of radiographic studies, pulse oximetry and re-evaluation of patient's condition.   Final Clinical Impressions(s) / ED Diagnoses   Final diagnoses:  Neck injury  Laceration of chest wall, initial encounter    New Prescriptions New Prescriptions   No medications on file     Lavera Guiseana Duo Twylah Bennetts, MD 02/05/16 1017

## 2016-02-05 NOTE — H&P (Signed)
Devin Cortez is an 40 y.o. male.   Chief Complaint: Chainsaw accident HPI: Devin Cortez was working with a chainsaw when it kicked back and struck him in the neck and chest. He did not fall or lose consciousness. He was made a level 1 on arrival. He denied SOB or severe pain.   History reviewed. No pertinent past medical history.  History reviewed. No pertinent surgical history.  History reviewed. No pertinent family history. Social History:  reports that he has been smoking.  He has never used smokeless tobacco. He reports that he does not drink alcohol. His drug history is not on file.  Allergies: No Known Allergies  Results for orders placed or performed during the hospital encounter of 02/05/16 (from the past 48 hour(s))  Type and screen     Status: None (Preliminary result)   Collection Time: 02/05/16  9:47 AM  Result Value Ref Range   ABO/RH(D) PENDING    Antibody Screen PENDING    Sample Expiration 02/08/2016    Unit Number [id number]    Blood Component Type RBC LR PHER1    Unit division 00    Status of Unit ISSUED    Unit tag comment VERBAL ORDERS PER DR LIU    Transfusion Status OK TO TRANSFUSE    Crossmatch Result PENDING    Unit Number [id number]    Blood Component Type RED CELLS,LR    Unit division 00    Status of Unit ISSUED    Unit tag comment VERBAL ORDERS PER DR LIU    Transfusion Status OK TO TRANSFUSE    Crossmatch Result PENDING   Prepare fresh frozen plasma     Status: None (Preliminary result)   Collection Time: 02/05/16  9:47 AM  Result Value Ref Range   Unit Number [id number]    Blood Component Type LIQ PLASMA    Unit division 00    Status of Unit ISSUED    Unit tag comment VERBAL ORDERS PER DR LIU    Transfusion Status OK TO TRANSFUSE    Unit Number [id number]    Blood Component Type LIQ PLASMA    Unit division 00    Status of Unit ISSUED    Unit tag comment VERBAL ORDERS PER DR LIU    Transfusion Status OK TO TRANSFUSE    No results  found.  Review of Systems  Constitutional: Negative for weight loss.  HENT: Negative for ear discharge, ear pain, hearing loss and tinnitus.   Eyes: Negative for blurred vision, double vision, photophobia and pain.  Respiratory: Negative for cough, sputum production and shortness of breath.   Cardiovascular: Positive for chest pain.  Gastrointestinal: Negative for abdominal pain, nausea and vomiting.  Genitourinary: Negative for dysuria, flank pain, frequency and urgency.  Musculoskeletal: Negative for back pain, falls, joint pain, myalgias and neck pain.  Neurological: Negative for dizziness, tingling, sensory change, focal weakness, loss of consciousness and headaches.  Endo/Heme/Allergies: Does not bruise/bleed easily.  Psychiatric/Behavioral: Negative for depression, memory loss and substance abuse. The patient is not nervous/anxious.     Blood pressure (!) 134/101, pulse 68, temperature 98.4 F (36.9 C), temperature source Oral, resp. rate 21, height 5\' 10"  (1.778 m), weight 86.2 kg (190 lb), SpO2 97 %. Physical Exam  Vitals reviewed. Constitutional: He is oriented to person, place, and time. He appears well-developed and well-nourished. He is cooperative. No distress. Cervical collar and nasal cannula in place.  HENT:  Head: Normocephalic and atraumatic. Head is without raccoon's eyes, without Battle's  sign, without abrasion, without contusion and without laceration.  Right Ear: Hearing, tympanic membrane, external ear and ear canal normal. No lacerations. No drainage or tenderness. No foreign bodies. Tympanic membrane is not perforated. No hemotympanum.  Left Ear: Hearing, tympanic membrane, external ear and ear canal normal. No lacerations. No drainage or tenderness. No foreign bodies. Tympanic membrane is not perforated. No hemotympanum.  Nose: Nose normal. No nose lacerations, sinus tenderness, nasal deformity or nasal septal hematoma. No epistaxis.  Mouth/Throat: Uvula is  midline, oropharynx is clear and moist and mucous membranes are normal. No lacerations. No oropharyngeal exudate.  Eyes: Conjunctivae, EOM and lids are normal. Pupils are equal, round, and reactive to light. Right eye exhibits no discharge. Left eye exhibits no discharge. No scleral icterus.  Neck: Trachea normal. Neck supple. No JVD present. No spinous process tenderness and no muscular tenderness present. Carotid bruit is not present. No tracheal deviation present. No thyromegaly present.    Cardiovascular: Normal rate, regular rhythm, normal heart sounds, intact distal pulses and normal pulses.  Exam reveals no gallop and no friction rub.   No murmur heard. Respiratory: Effort normal and breath sounds normal. No stridor. No respiratory distress. He has no wheezes. He has no rales. He exhibits tenderness. He exhibits no bony tenderness, no laceration and no crepitus.  GI: Soft. Normal appearance and bowel sounds are normal. He exhibits no distension. There is no tenderness. There is no rigidity, no rebound, no guarding and no CVA tenderness.  Genitourinary: Penis normal.  Musculoskeletal: Normal range of motion. He exhibits no edema or tenderness.  Lymphadenopathy:    He has no cervical adenopathy.  Neurological: He is alert and oriented to person, place, and time. He has normal strength. No cranial nerve deficit or sensory deficit. GCS eye subscore is 4. GCS verbal subscore is 5. GCS motor subscore is 6.  Skin: Skin is warm, dry and intact. He is not diaphoretic.  Psychiatric: He has a normal mood and affect. His speech is normal and behavior is normal.     Assessment/Plan Chainsaw accident with neck/chest lacs -- Will get CTA neck/chest then to OR for I&D and repair. Hx/o addiction -- Does not want narcotics    Devin Caldron, PA-C Pager: [phone] General Tra[facility] [place] Pager: [phone] 02/05/2016, 9:58 AM

## 2016-02-06 LAB — CBC
HCT: 39.3 % (ref 39.0–52.0)
Hemoglobin: 12.7 g/dL — ABNORMAL LOW (ref 13.0–17.0)
MCH: 30.6 pg (ref 26.0–34.0)
MCHC: 32.3 g/dL (ref 30.0–36.0)
MCV: 94.7 fL (ref 78.0–100.0)
PLATELETS: 171 10*3/uL (ref 150–400)
RBC: 4.15 MIL/uL — AB (ref 4.22–5.81)
RDW: 12.4 % (ref 11.5–15.5)
WBC: 7.2 10*3/uL (ref 4.0–10.5)

## 2016-02-06 MED ORDER — NAPROXEN 500 MG PO TABS: 500.0000 mg | ORAL_TABLET | Freq: Two times a day (BID) | ORAL | 0 refills | Status: AC

## 2016-02-06 MED ORDER — OMEPRAZOLE 20 MG PO CPDR: 20.0000 mg | DELAYED_RELEASE_CAPSULE | Freq: Every day | ORAL | 1 refills | Status: AC

## 2016-02-06 NOTE — Progress Notes (Signed)
Discharge instructions were gone over with patient. Home medications discussed. Follow up appointment is to be made. Diet, activity, and signs and symptoms of infection discussed. Reasons to call the doctor were gone over. Patient verbalized understanding of the instructions and had no further questions.

## 2016-02-06 NOTE — Discharge Summary (Signed)
Physician Discharge Summary  Patient ID: Devin BlackwaterSamuel Cortez MRN: [id number] DOB/AGE: 1976-04-08 40 y.o.  Admit date: 02/05/2016 Discharge date: 02/06/2016  Admission Diagnoses:  Discharge Diagnoses:  Active Problems:   Laceration of neck   Discharged Condition: good  Hospital Course: 40 yo suffered chainsaw injury came to ER as level I trauma found to have large superficial laceration to right chest and left neck. CTA performed showing no vascular, bony, or deep organ injuries. He went to OR for laceration washout and repair. Post op he did well. He refused narcotics due to his recovery status. Pain was well tolerated with NSAIDs, he tolerated a diet, ambulated multiple times and was discharged home POD 1  Consults: none  Significant Diagnostic Studies: CTA without vascular injury or bony injury  Treatments: OR for laceration repair of left neck and right chest  Discharge Exam: Blood pressure 115/72, pulse (!) 56, temperature 97.5 F (36.4 C), temperature source Oral, resp. rate 17, height 5\' 10"  (1.778 m), weight 86.2 kg (190 lb), SpO2 99 %. General appearance: alert and cooperative Neck: no adenopathy, no carotid bruit, no JVD, incision bandaged and dry Resp: clear to auscultation bilaterally Cardio: regular rate and rhythm, S1, S2 normal, no murmur, click, rub or gallop GI: soft, non-tender; bowel sounds normal; no masses,  no organomegaly  Disposition: Final discharge disposition not confirmed  Discharge Instructions    Call MD for:  difficulty breathing, headache or visual disturbances    Complete by:  As directed   Call MD for:  redness, tenderness, or signs of infection (pain, swelling, redness, odor or green/yellow discharge around incision site)    Complete by:  As directed   Call MD for:  severe uncontrolled pain    Complete by:  As directed   Call MD for:  temperature >100.4    Complete by:  As directed   Diet - low sodium heart healthy    Complete by:  As directed   Discharge instructions    Complete by:  As directed   Remove dressing 8/28. Ok to shower 8/28  Ok for light duty 8/28. Ok for full duty as tollerated 9/11   Increase activity slowly    Complete by:  As directed       Medication List    STOP taking these medications   ALEVE 220 MG tablet Generic drug:  naproxen sodium   CLEAR EYES OP     TAKE these medications   naproxen 500 MG tablet Commonly known as:  NAPROSYN Take 1 tablet (500 mg total) by mouth 2 (two) times daily with a meal.   omeprazole 20 MG capsule Commonly known as:  PRILOSEC Take 1 capsule (20 mg total) by mouth daily.   Vitamin E Liqd Apply 1 application topically 2 (two) times daily as needed (itching). Vitamin E oil        Signed: De BlanchLuke Aaron Quantavia Frith 02/06/2016, 6:44 AM

## 2016-02-07 ENCOUNTER — Encounter (HOSPITAL_COMMUNITY): Payer: Self-pay | Admitting: General Surgery

## 2016-06-20 ENCOUNTER — Ambulatory Visit
Admit: 2016-06-20 | Discharge: 2016-06-20 | Payer: BLUE CROSS/BLUE SHIELD | Attending: Family Medicine | Primary: Family Medicine

## 2016-06-20 DIAGNOSIS — Z0000 Encounter for general adult medical examination without abnormal findings: Secondary | ICD-10-CM

## 2016-06-20 LAB — CBC WITH AUTO DIFFERENTIAL
Basophils %: 0.3 % (ref 0.0–1.0)
Basophils Absolute: 0 10*3/uL (ref 0.00–0.20)
Eosinophils %: 0.3 % (ref 0.0–5.0)
Eosinophils Absolute: 0 10*3/uL (ref 0.00–0.60)
Hematocrit: 51 % (ref 42.0–52.0)
Hemoglobin: 16.6 g/dL (ref 14.0–18.0)
Lymphocytes %: 8.6 % — ABNORMAL LOW (ref 20.0–40.0)
Lymphocytes Absolute: 0.7 10*3/uL — ABNORMAL LOW (ref 1.1–4.5)
MCH: 30.4 pg (ref 27.0–31.0)
MCHC: 32.5 g/dL — ABNORMAL LOW (ref 33.0–37.0)
MCV: 93.4 fL (ref 80.0–94.0)
MPV: 11.9 fL (ref 9.4–12.4)
Monocytes %: 7.4 % (ref 0.0–10.0)
Monocytes Absolute: 0.6 10*3/uL (ref 0.00–0.90)
Neutrophils %: 82.9 % — ABNORMAL HIGH (ref 50.0–65.0)
Neutrophils Absolute: 6.5 10*3/uL (ref 1.5–7.5)
PLATELET SLIDE REVIEW: ADEQUATE
Platelets: 112 10*3/uL — ABNORMAL LOW (ref 130–400)
RBC: 5.46 M/uL (ref 4.70–6.10)
RDW: 12.8 % (ref 11.5–14.5)
WBC: 7.8 10*3/uL (ref 4.8–10.8)

## 2016-06-20 LAB — COMPREHENSIVE METABOLIC PANEL
ALT: 16 U/L (ref 5–41)
AST: 20 U/L (ref 5–40)
Albumin: 4.7 g/dL (ref 3.5–5.2)
Alkaline Phosphatase: 64 U/L (ref 40–130)
Anion Gap: 13 mmol/L (ref 7–19)
BUN: 15 mg/dL (ref 6–20)
CO2: 25 mmol/L (ref 22–29)
Calcium: 9.1 mg/dL (ref 8.6–10.0)
Chloride: 102 mmol/L (ref 98–111)
Creatinine: 0.8 mg/dL (ref 0.5–1.2)
GFR Non-African American: >60 (ref >60)
Glucose: 76 mg/dL (ref 74–109)
Potassium: 4.4 mmol/L (ref 3.5–5.0)
Sodium: 140 mmol/L (ref 136–145)
Total Bilirubin: 1.9 mg/dL — ABNORMAL HIGH (ref 0.2–1.2)
Total Protein: 7.9 g/dL (ref 6.6–8.7)

## 2016-06-20 LAB — LIPID PANEL
Cholesterol, Total: 197 mg/dL (ref 160–199)
HDL: 44 mg/dL — ABNORMAL LOW (ref 55–121)
LDL Calculated: 133 mg/dL (ref <100)
Triglycerides: 101 mg/dL (ref 0–149)

## 2016-06-20 NOTE — Progress Notes (Signed)
Subjective:      Patient ID: Vincent Thornton is a 41 y.o. male.    HPI  Well Adult Physical   Patient here for a comprehensive physical exam.The patient reports no problems  Do you take any herbs or supplements that were not prescribed by a doctor? no Are you taking calcium supplements? no Are you taking aspirin daily? no    Past Medical History:   Diagnosis Date   . Anxiety      No current outpatient prescriptions on file prior to visit.     No current facility-administered medications on file prior to visit.      No Known Allergies  Past Surgical History:   Procedure Laterality Date   . ANKLE SURGERY Bilateral        Family History   Problem Relation Age of Onset   . Heart Attack Father    . Cancer Father    . Cancer Paternal Grandmother    . Heart Disease Paternal Uncle        Social History     Social History   . Marital status: Single     Spouse name: N/A   . Number of children: N/A   . Years of education: N/A     Occupational History   . disabled      Social History Main Topics   . Smoking status: Former Smoker     Packs/day: 1.00     Quit date: 06/25/2011   . Smokeless tobacco: Current User   . Alcohol use Yes      Comment: occ   . Drug use: Unknown   . Sexual activity: Not on file     Other Topics Concern   . Not on file     Social History Narrative   . No narrative on file           Review of Systems   Constitutional: Negative for activity change, appetite change and fatigue.   HENT: Negative for congestion and rhinorrhea.    Eyes: Negative for visual disturbance.   Respiratory: Negative for cough.    Cardiovascular: Negative for chest pain and palpitations.   Gastrointestinal: Negative for abdominal pain, constipation, diarrhea, nausea and vomiting.   Genitourinary: Negative for decreased urine volume and difficulty urinating.   Musculoskeletal: Negative for arthralgias.   Skin: Negative for color change and rash.   Allergic/Immunologic: Negative for immunocompromised state.   Neurological: Negative for  seizures and headaches.   Hematological: Does not bruise/bleed easily.   Psychiatric/Behavioral: Negative for agitation and sleep disturbance.       Objective:   Physical Exam   Constitutional: He is oriented to person, place, and time. He appears well-developed and well-nourished. No distress.   HENT:   Head: Normocephalic and atraumatic.   Neck: Normal range of motion. Neck supple. No thyromegaly present.   Cardiovascular: Normal rate, regular rhythm, normal heart sounds and intact distal pulses.    Pulmonary/Chest: Effort normal and breath sounds normal. No respiratory distress. He has no wheezes.   Abdominal: Soft. Bowel sounds are normal. There is no tenderness.   Lymphadenopathy:     He has no cervical adenopathy.   Neurological: He is alert and oriented to person, place, and time.   Skin: Skin is warm and dry. No rash noted. He is not diaphoretic.   Psychiatric: He has a normal mood and affect. His behavior is normal. Judgment and thought content normal.     BP 124/86 (Site: Right Arm,  Position: Sitting, Cuff Size: Large Adult)   Pulse 88   Temp 99.2 F (37.3 C) (Temporal)   Resp 18   Ht 5\' 10"  (1.778 m)   Wt 213 lb (96.6 kg)   SpO2 98%   BMI 30.56 kg/m     Assessment:        ICD-10-CM ICD-9-CM    1. Wellness examination Z00.00 V70.0 CBC Auto Differential      Comprehensive Metabolic Panel      Lipid Panel   2. Screening, lipid Z13.220 V77.91 CBC Auto Differential      Comprehensive Metabolic Panel      Lipid Panel            Plan:      Follow-up in one year for physical unless needed sooner.

## 2017-06-21 ENCOUNTER — Encounter: Attending: Family Medicine | Primary: Family Medicine

## 2018-02-10 IMAGING — CT CT ANGIO CHEST
3 of 7 series · 13 of 36 positions shown · IV contrast (Omni 300)
Comparison: Chest x-ray today.

CLINICAL DATA: Laceration at the upper chest and base of neck.

EXAM:
CT ANGIOGRAPHY CHEST WITH CONTRAST
TECHNIQUE: Multidetector CT imaging of the chest was performed using the
standard protocol during bolus administration of intravenous
contrast. Multiplanar CT image reconstructions and MIPs were
obtained to evaluate the vascular anatomy.
CONTRAST:  100 cc Isovue 370 IV

[Series 5: pe 2mm · axial · 0.67mm/px · z∈[-472,-160]mm · 8 of 202 slices shown]
[im 23/202  lung]
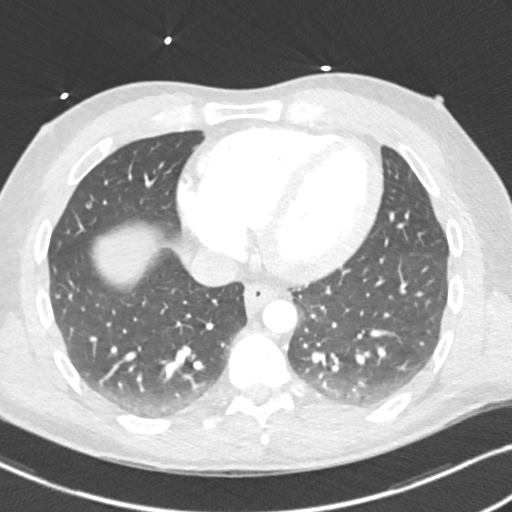
[im 45/202  mediastinal]
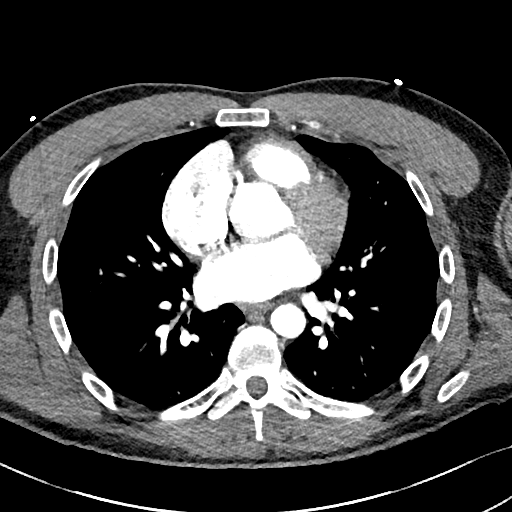
[im 68/202  lung]
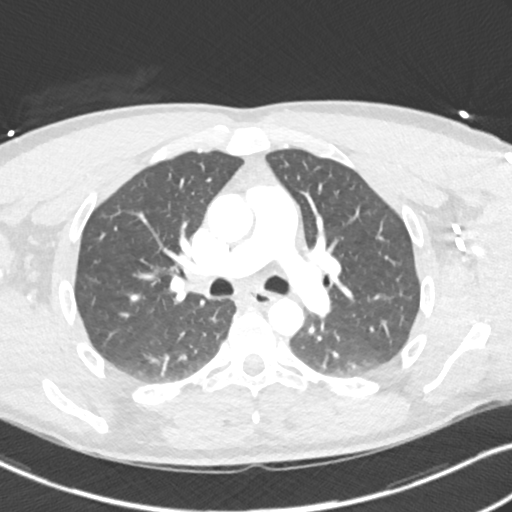
[im 90/202  mediastinal]
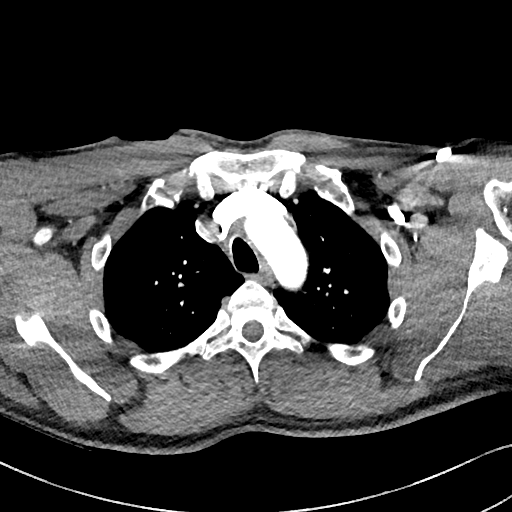
[im 112/202  lung]
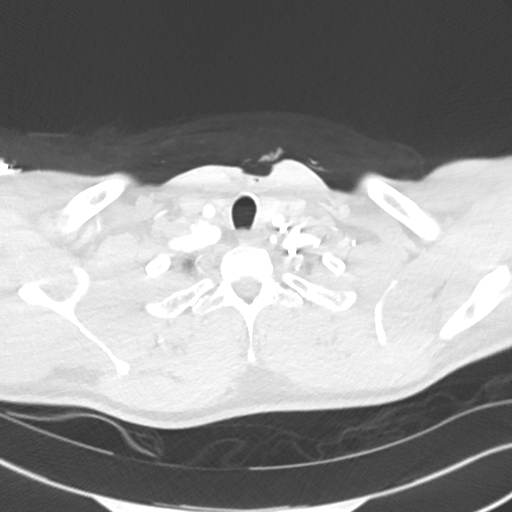
[im 135/202  mediastinal]
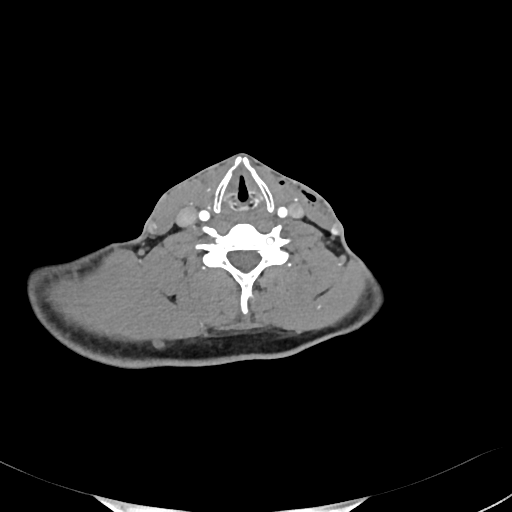
[im 157/202  lung]
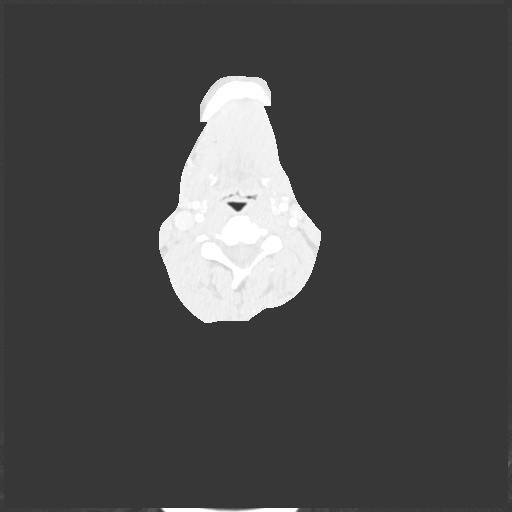
[im 179/202  mediastinal]
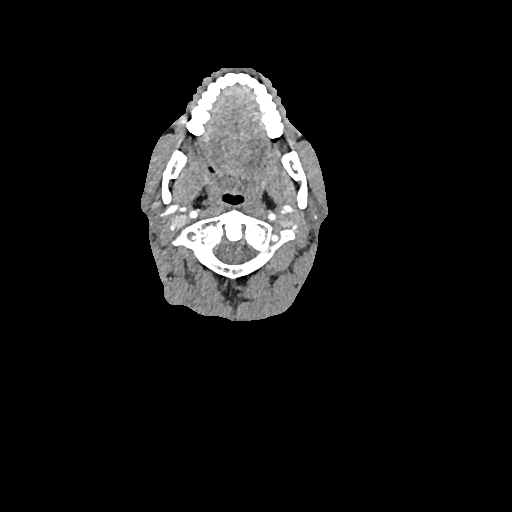

[Series 6: pe lung · axial · 0.67mm/px · z∈[-469,-325]mm · 4 of 120 slices shown]
[im 24/120  mediastinal]
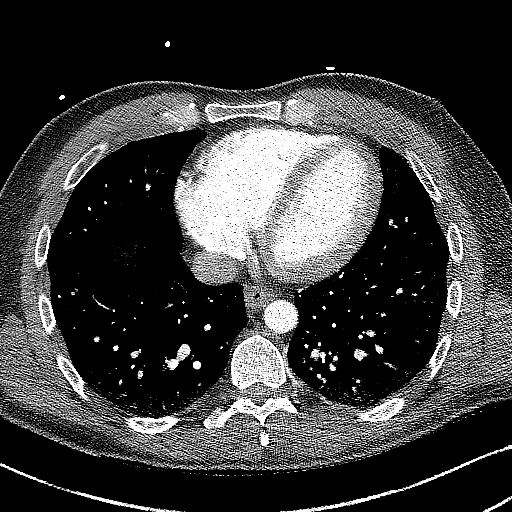
[im 48/120  mediastinal]
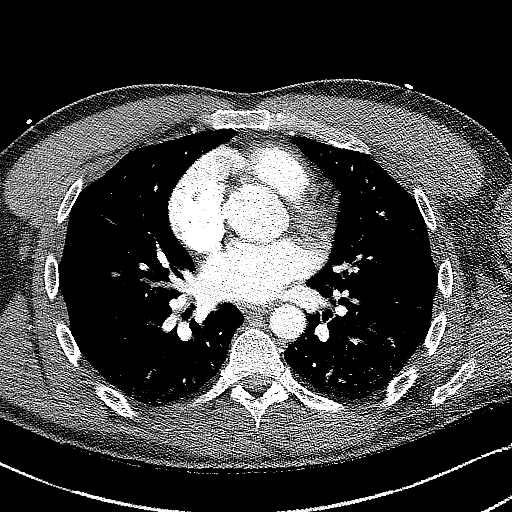
[im 72/120  mediastinal]
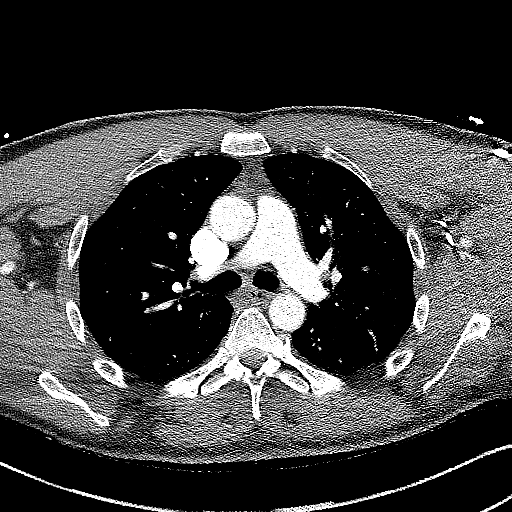
[im 96/120  mediastinal]
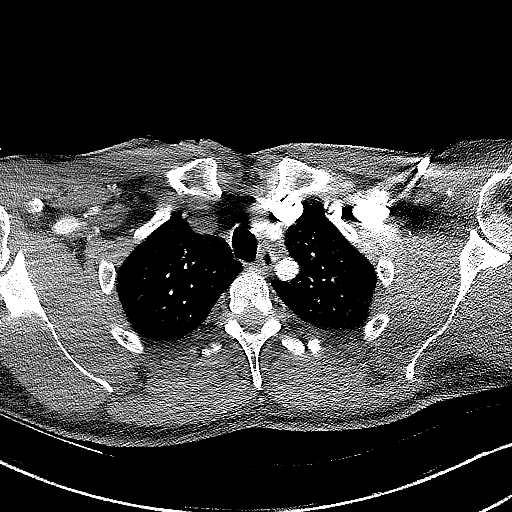

[Series 8: pe 2mm cor · coronal · 0.51mm/px · 1 of 96 slices shown]
[im 48/96  mediastinal]
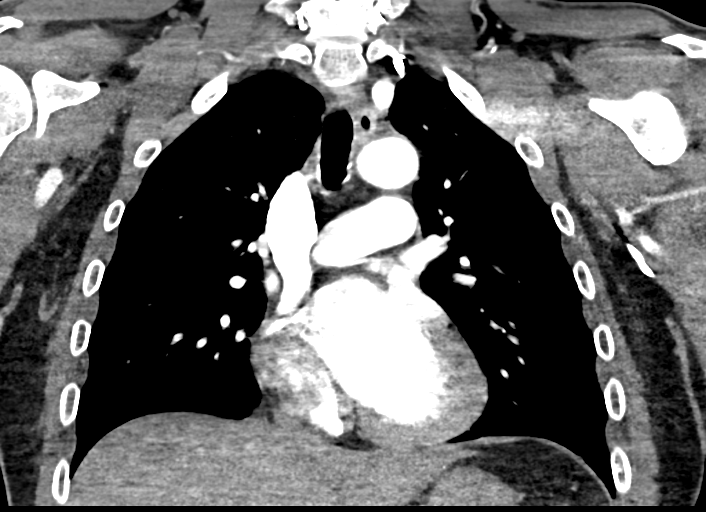

[13 of 36 positions shown; findings below may reference images not displayed]

FINDINGS: Cardiovascular: Heart is upper limits normal in size. Aorta is
normal caliber. Great vessels are patent without evidence of injury.

Mediastinum/Nodes: No mediastinal, hilar, or axillary adenopathy.

Lungs/Pleura: Lungs are clear. No focal airspace opacities or
suspicious nodules. No effusions. No pneumothorax.

Upper Abdomen: Imaging into the upper abdomen shows no acute
findings.

Musculoskeletal: Soft tissue defect noted anteriorly across the
right clavicle and into the lower neck. No acute bony abnormality.

Review of the MIP images confirms the above findings.
IMPRESSION: No evidence of arterial/vascular injury in the lower neck or chest.

No pneumothorax.

## 2018-02-10 IMAGING — CT CT ANGIO NECK
1 of 7 series · 8 of 46 positions shown, 13 images · IV contrast (Omni 300)
Comparison: None.

CLINICAL DATA: Anterior neck laceration with saw

EXAM:
CT ANGIOGRAPHY NECK
TECHNIQUE: Multidetector CT imaging of the neck was performed using the
standard protocol during bolus administration of intravenous
contrast. Multiplanar CT image reconstructions and MIPs were
obtained to evaluate the vascular anatomy. Carotid stenosis
measurements (when applicable) are obtained utilizing NASCET
criteria, using the distal internal carotid diameter as the
denominator.
CONTRAST:  100 mL Isovue 370 IV

[Series 1: angio neck · axial · 0.36mm/px · z∈[-340,-144]mm · 8 of 128 slices shown, 13 images]
[im 15/128  soft-tissue]
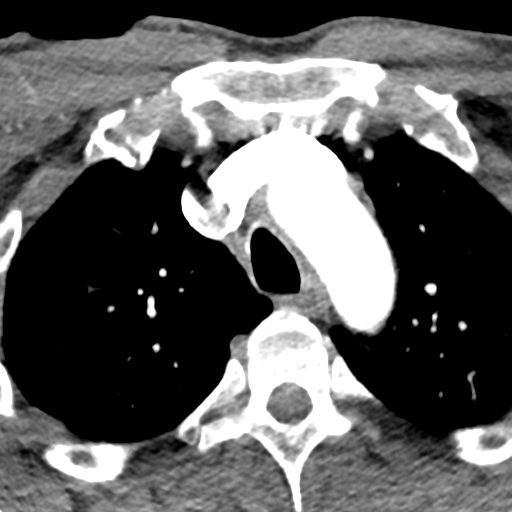
[im 15/128  bone]
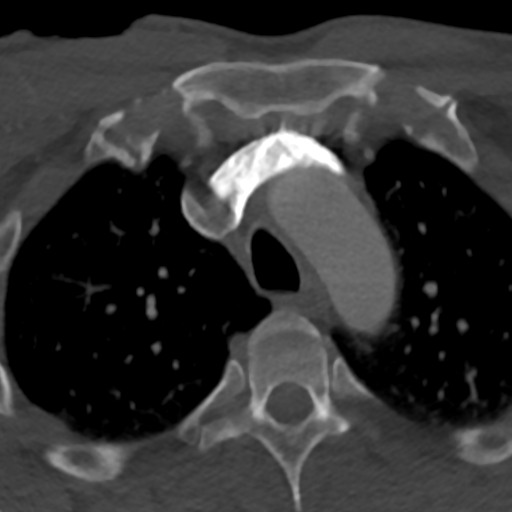
[im 29/128  soft-tissue]
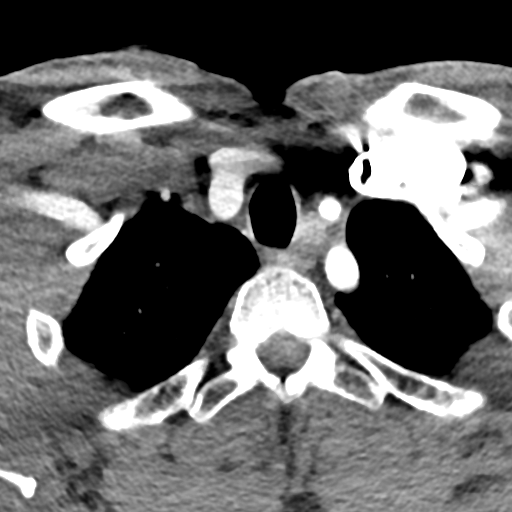
[im 43/128  soft-tissue]
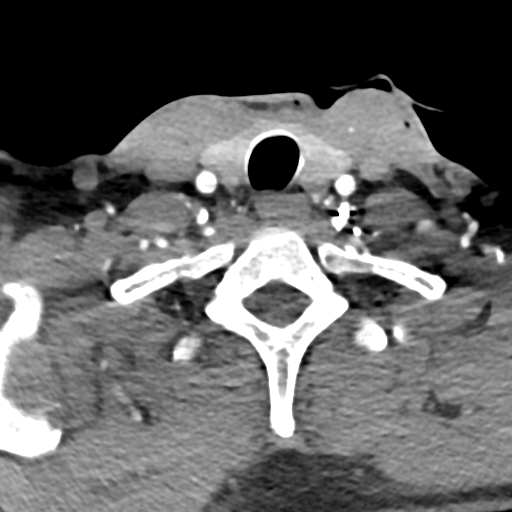
[im 57/128  soft-tissue]
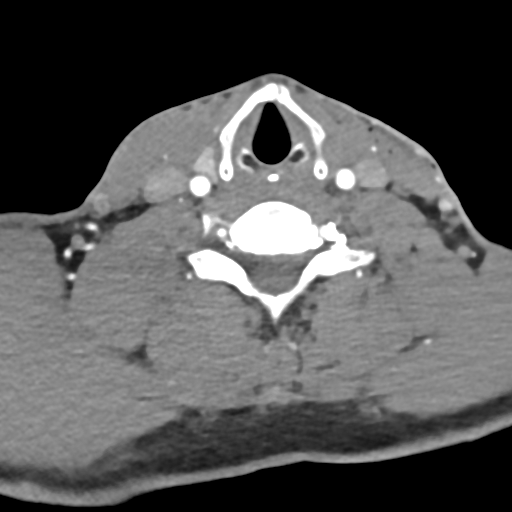
[im 71/128  soft-tissue]
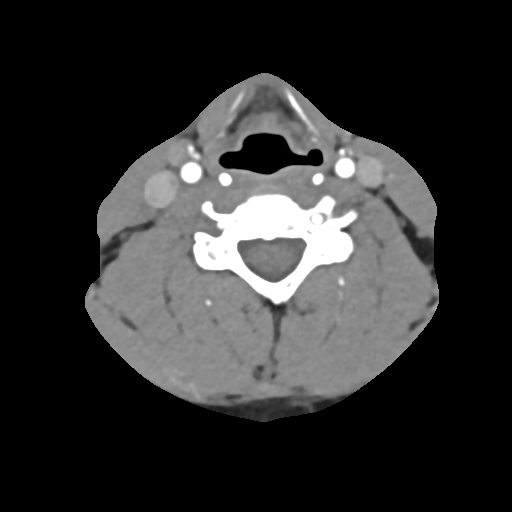
[im 71/128  lung]
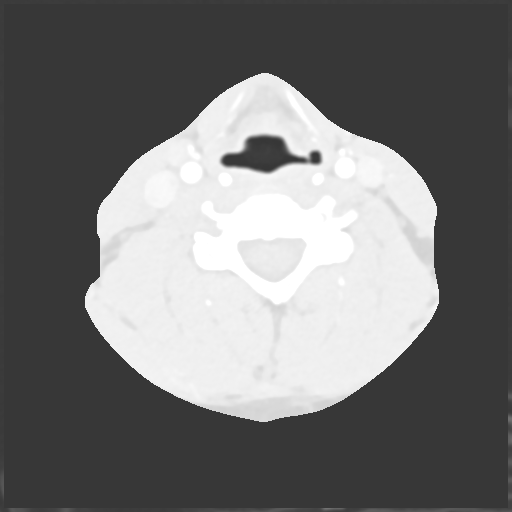
[im 85/128  soft-tissue]
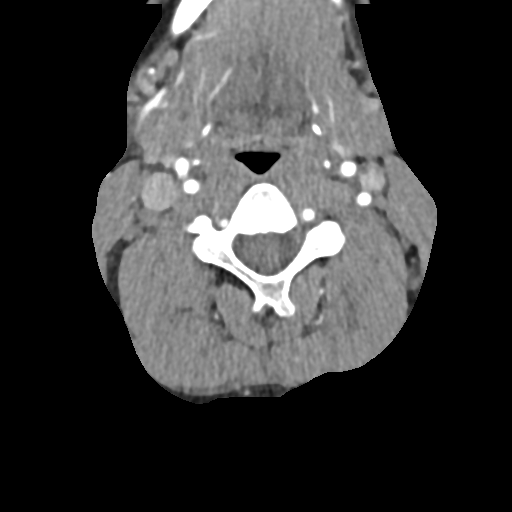
[im 85/128  lung]
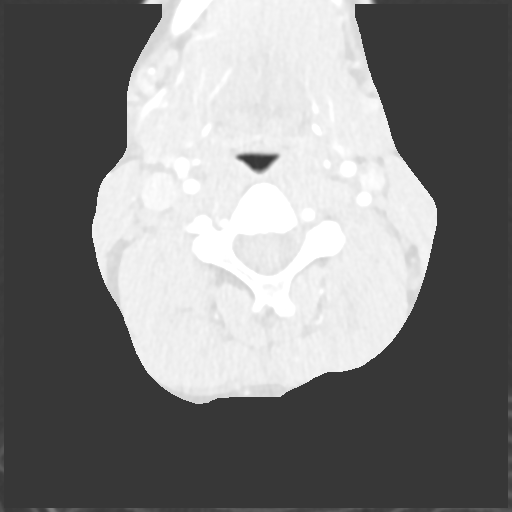
[im 99/128  soft-tissue]
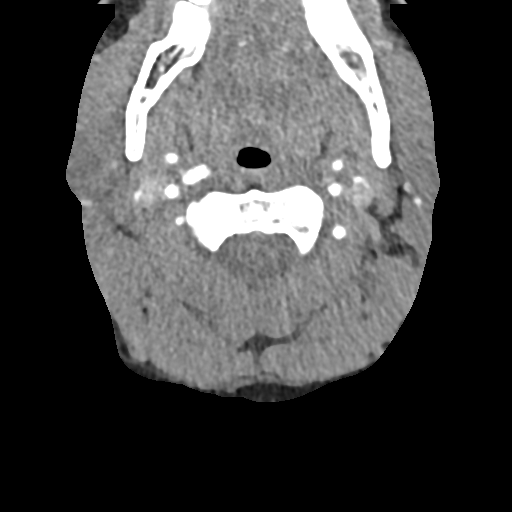
[im 99/128  lung]
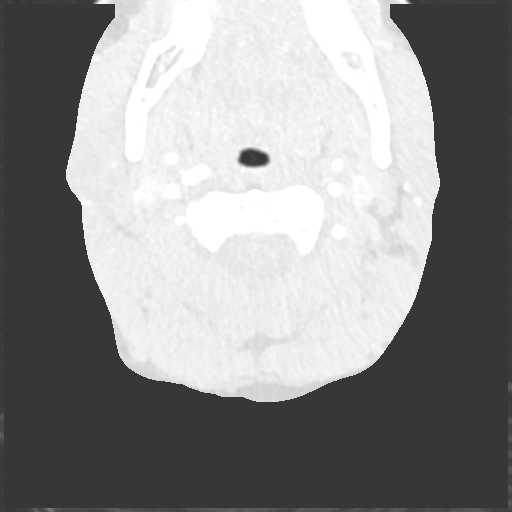
[im 113/128  soft-tissue]
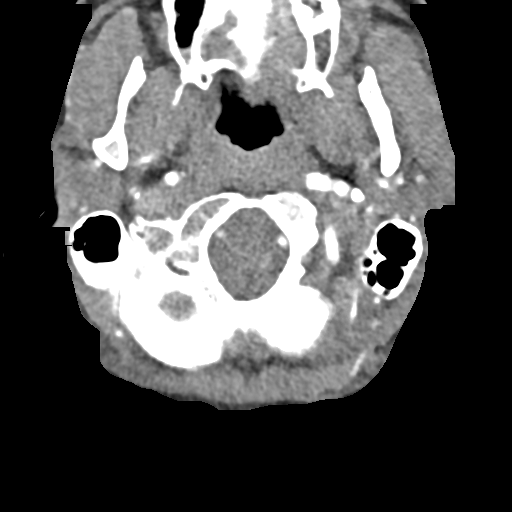
[im 113/128  lung]
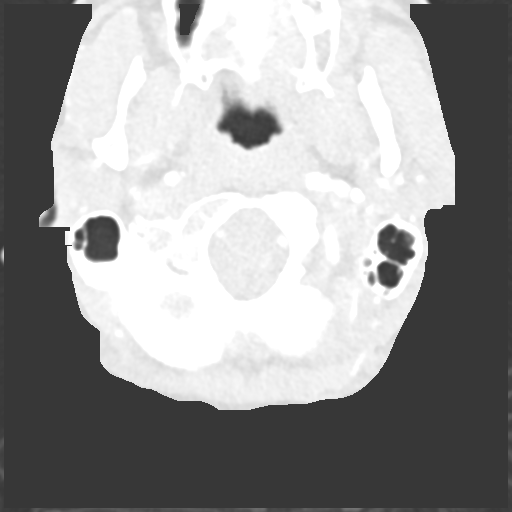

[8 of 46 positions shown; findings below may reference images not displayed]

FINDINGS: Aortic arch: Normal aortic arch. Proximal great vessels widely
patent without irregularity.

Right carotid system: Normal right carotid. No vessel injury or
extravasation. No stenosis.

Left carotid system: Normal left carotid. Negative for vessel injury
or extravasation. No stenosis.

Vertebral arteries:Both vertebral arteries patent to the basilar
without stenosis. Left vertebral artery dominant.

Skeleton: Normal cervical spine. No fracture or degenerative change.

Other neck: Anterior neck laceration on the left with soft tissue
swelling and gas in the soft tissues. Probable injury to the
sternocleidomastoid muscle and strap muscles. No active
extravasation of contrast.

Upper chest: Lung apices clear. No pneumothorax or pleural effusion.
IMPRESSION: Anterior neck laceration on the left with soft tissue swelling. No
extravasation of contrast

No large vessel injury.

## 2019-12-11 ENCOUNTER — Inpatient Hospital Stay
Admit: 2019-12-11 | Discharge: 2019-12-11 | Disposition: A | Discharge status: Home / Self Care | Payer: BLUE CROSS/BLUE SHIELD | Attending: Emergency Medicine

## 2019-12-11 DIAGNOSIS — F10239 Alcohol dependence with withdrawal, unspecified: Principal | ICD-10-CM

## 2019-12-11 LAB — CBC WITH AUTO DIFFERENTIAL
Basophils %: 0.9 % (ref 0.0–1.0)
Basophils Absolute: 0 10*3/uL (ref 0.00–0.20)
Eosinophils %: 0 % (ref 0.0–5.0)
Eosinophils Absolute: 0 10*3/uL (ref 0.00–0.60)
Hematocrit: 47.4 % (ref 42.0–52.0)
Hemoglobin: 16.3 g/dL (ref 14.0–18.0)
Immature Granulocytes #: 0 10*3/uL
Lymphocytes %: 16.4 % — ABNORMAL LOW (ref 20.0–40.0)
Lymphocytes Absolute: 0.6 10*3/uL — ABNORMAL LOW (ref 1.1–4.5)
MCH: 33.7 pg — ABNORMAL HIGH (ref 27.0–31.0)
MCHC: 34.4 g/dL (ref 33.0–37.0)
MCV: 98.1 fL — ABNORMAL HIGH (ref 80.0–94.0)
MPV: 10.2 fL (ref 9.4–12.4)
Monocytes %: 15.8 % — ABNORMAL HIGH (ref 0.0–10.0)
Monocytes Absolute: 0.5 10*3/uL (ref 0.00–0.90)
Neutrophils %: 66.6 % — ABNORMAL HIGH (ref 50.0–65.0)
Neutrophils Absolute: 2.3 10*3/uL (ref 1.5–7.5)
Platelets: 128 10*3/uL — ABNORMAL LOW (ref 130–400)
RBC: 4.83 M/uL (ref 4.70–6.10)
RDW: 12.8 % (ref 11.5–14.5)
WBC: 3.4 10*3/uL — ABNORMAL LOW (ref 4.8–10.8)

## 2019-12-11 LAB — ETHANOL: Ethanol Lvl: <10 mg/dL

## 2019-12-11 LAB — COMPREHENSIVE METABOLIC PANEL W/ REFLEX TO MG FOR LOW K
ALT: 221 U/L — ABNORMAL HIGH (ref 5–41)
AST: 200 U/L — ABNORMAL HIGH (ref 5–40)
Albumin: 4.4 g/dL (ref 3.5–5.2)
Alkaline Phosphatase: 83 U/L (ref 40–130)
Anion Gap: 12 mmol/L (ref 7–19)
BUN: 12 mg/dL (ref 6–20)
CO2: 31 mmol/L — ABNORMAL HIGH (ref 22–29)
Calcium: 9.9 mg/dL (ref 8.6–10.0)
Chloride: 97 mmol/L — ABNORMAL LOW (ref 98–111)
Creatinine: 0.7 mg/dL (ref 0.5–1.2)
GFR African American: >59 (ref >59)
GFR Non-African American: >60 (ref >60)
Glucose: 115 mg/dL — ABNORMAL HIGH (ref 74–109)
Potassium reflex Magnesium: 3.5 mmol/L (ref 3.5–5.0)
Sodium: 140 mmol/L (ref 136–145)
Total Bilirubin: 2.6 mg/dL — ABNORMAL HIGH (ref 0.2–1.2)
Total Protein: 7.6 g/dL (ref 6.6–8.7)

## 2019-12-11 LAB — MAGNESIUM: Magnesium: 1.6 mg/dL (ref 1.6–2.6)

## 2019-12-11 MED ORDER — CHLORDIAZEPOXIDE HCL 5 MG PO CAPS
5 MG | Freq: Once | ORAL | Status: AC
Start: 2019-12-11 — End: 2019-12-11
  Administered 2019-12-11: 15:00:00 5 mg via ORAL

## 2019-12-11 MED ORDER — LORAZEPAM 2 MG/ML IJ SOLN
2 MG/ML | Freq: Once | INTRAMUSCULAR | Status: AC
Start: 2019-12-11 — End: 2019-12-11
  Administered 2019-12-11: 15:00:00 1 mg via INTRAVENOUS

## 2019-12-11 MED ORDER — CHLORDIAZEPOXIDE HCL 25 MG PO CAPS
25 MG | ORAL_CAPSULE | Freq: Three times a day (TID) | ORAL | 0 refills | Status: AC | PRN
Start: 2019-12-11 — End: 2019-12-14

## 2019-12-11 MED ORDER — FLUTICASONE PROPIONATE 50 MCG/ACT NA SUSP
50 MCG/ACT | Freq: Every day | NASAL | 0 refills | Status: AC
Start: 2019-12-11 — End: ?

## 2019-12-11 MED FILL — CHLORDIAZEPOXIDE HCL 5 MG PO CAPS: 5 mg | ORAL | Qty: 1

## 2019-12-11 MED FILL — LORAZEPAM 2 MG/ML IJ SOLN: 2 mg/mL | INTRAMUSCULAR | Qty: 1

## 2019-12-11 NOTE — ED Provider Notes (Signed)
[facility] EMERGENCY DEPT  EMERGENCY DEPARTMENT ENCOUNTER      Pt Name: Vincent Thornton  MRN: [id number]  Birthdate Mar 09, 1976  Date of evaluation: 12/11/2019  Provider: Tyson AliasBrittni L Araceli Arango, APRN - CNP    CHIEF COMPLAINT       Chief Complaint   Patient presents with   ??? Alcohol Problem     present with concerns about alcohol withdrawals, last drink yesterday   ??? Head Injury     was hit in eye with baseball couple weeks ago, headache and hard to focus         HISTORY OF PRESENT ILLNESS   (Location/Symptom, Timing/Onset, Context/Setting, Quality, Duration, Modifying Factors, Severity)  Note limiting factors.   Vincent Thornton is a 44 y.o. male who presents to the emergency department with concern for wanting help to quit drinking. States he drinks daily, "a lot". He tried to quit on his own about a month ago but the withdrawals were to severe. Last drink this time was yesterday. States he wants help with withdrawals. Current symptoms include nausea and feeling jittery. He also reports he was hit in right eye with a baseball about a month ago. [facility] followed up with an eye doctor, both of whom told him he was fine, but feels like it is hard to focus and like his eyes arent working the same as before. He also feels some fullness in his left ear.     HPI    Nursing Notes were reviewed.    REVIEW OF SYSTEMS    (2-9 systems for level 4, 10 or more for level 5)     Review of Systems   Constitutional: Negative for chills and fever.   HENT: Negative for rhinorrhea and sore throat.    Eyes: Negative for discharge and redness.   Respiratory: Negative for cough, chest tightness and shortness of breath.    Cardiovascular: Negative for chest pain and palpitations.   Gastrointestinal: Positive for nausea. Negative for abdominal pain, diarrhea and vomiting.   Genitourinary: Negative for flank pain, frequency and urgency.   Musculoskeletal: Negative for back pain.   Neurological: Negative for dizziness,  tremors, syncope, speech difficulty, weakness, light-headedness and headaches.       Except as noted above the remainder of the review of systems was reviewed and negative.       PAST MEDICAL HISTORY     Past Medical History:   Diagnosis Date   ??? Anxiety          SURGICAL HISTORY       Past Surgical History:   Procedure Laterality Date   ??? ANKLE SURGERY Bilateral          CURRENT MEDICATIONS       Previous Medications    No medications on file       ALLERGIES     Patient has no known allergies.    FAMILY HISTORY       Family History   Problem Relation Age of Onset   ??? Heart Attack Father    ??? Cancer Father    ??? Cancer Paternal Grandmother    ??? Heart Disease Paternal Uncle           SOCIAL HISTORY       Social History     Socioeconomic History   ??? Marital status: Divorced     Spouse name: None   ??? Number of children: None   ??? Years of  education: None   ??? Highest education level: None   Occupational History   ??? Occupation: disabled   Tobacco Use   ??? Smoking status: Former Smoker     Packs/day: 1.00     Quit date: 06/25/2011     Years since quitting: 8.4   ??? Smokeless tobacco: Current User   Substance and Sexual Activity   ??? Alcohol use: Yes     Comment: occ   ??? Drug use: None   ??? Sexual activity: None   Other Topics Concern   ??? None   Social History Narrative   ??? None     Social Determinants of Health     Financial Resource Strain:    ??? Difficulty of Paying Living Expenses:    Food Insecurity:    ??? Worried About Programme researcher, broadcasting/film/video in the Last Year:    ??? Barista in the Last Year:    Transportation Needs:    ??? Freight forwarder (Medical):    ??? Lack of Transportation (Non-Medical):    Physical Activity:    ??? Days of Exercise per Week:    ??? Minutes of Exercise per Session:    Stress:    ??? Feeling of Stress :    Social Connections:    ??? Frequency of Communication with Friends and Family:    ??? Frequency of Social Gatherings with Friends and Family:    ??? Attends Religious Services:    ??? Database administrator or  Organizations:    ??? Attends Engineer, structural:    ??? Marital Status:    Catering manager Violence:    ??? Fear of Current or Ex-Partner:    ??? Emotionally Abused:    ??? Physically Abused:    ??? Sexually Abused:        SCREENINGS        Glasgow Coma Scale  Eye Opening: Spontaneous  Best Verbal Response: Oriented  Best Motor Response: Obeys commands  Glasgow Coma Scale Score: 15               PHYSICAL EXAM    (up to 7 for level 4, 8 or more for level 5)     ED Triage Vitals [12/11/19 0931]   BP Temp Temp src Pulse Resp SpO2 Height Weight   (!) 141/110 98.5 ??F (36.9 ??C) -- 80 17 94 % -- --       Physical Exam  Vitals reviewed.   Constitutional:       General: He is not in acute distress.     Appearance: Normal appearance. He is not ill-appearing, toxic-appearing or diaphoretic.   HENT:      Head: Normocephalic and atraumatic.      Right Ear: Tympanic membrane, ear canal and external ear normal.      Left Ear: A middle ear effusion is present.      Mouth/Throat:      Mouth: Mucous membranes are moist.      Pharynx: Oropharynx is clear.   Eyes:      Extraocular Movements: Extraocular movements intact.      Conjunctiva/sclera: Conjunctivae normal.      Pupils: Pupils are equal, round, and reactive to light.   Cardiovascular:      Rate and Rhythm: Normal rate and regular rhythm.      Pulses: Normal pulses.      Heart sounds: Normal heart sounds.   Pulmonary:      Effort: Pulmonary  effort is normal.      Breath sounds: Normal breath sounds.   Abdominal:      General: Bowel sounds are normal.      Palpations: Abdomen is soft.      Tenderness: There is no abdominal tenderness.   Skin:     General: Skin is warm.      Capillary Refill: Capillary refill takes less than 2 seconds.   Neurological:      Mental Status: He is alert.         DIAGNOSTIC RESULTS     EKG: All EKG's are interpreted by the Emergency Department Physician who either signs or Co-signs this chart in the absence of a cardiologist.        RADIOLOGY:    Non-plain film images such as CT, Ultrasound and MRI are read by the radiologist. Plain radiographic images are visualized and preliminarily interpreted by the emergency physician with the below findings:        Interpretation per the Radiologist below, if available at the time of this note:    No orders to display         ED BEDSIDE ULTRASOUND:   Performed by ED Physician - none    LABS:  Labs Reviewed   CBC WITH AUTO DIFFERENTIAL - Abnormal; Notable for the following components:       Result Value    WBC 3.4 (*)     MCV 98.1 (*)     MCH 33.7 (*)     Platelets 128 (*)     Neutrophils % 66.6 (*)     Lymphocytes % 16.4 (*)     Monocytes % 15.8 (*)     Lymphocytes Absolute 0.6 (*)     All other components within normal limits   COMPREHENSIVE METABOLIC PANEL W/ REFLEX TO MG FOR LOW K - Abnormal; Notable for the following components:    Chloride 97 (*)     CO2 31 (*)     Glucose 115 (*)     Total Bilirubin 2.6 (*)     ALT 221 (*)     AST 200 (*)     All other components within normal limits   ETHANOL   MAGNESIUM       All other labs were within normal range or not returned as of this dictation.    EMERGENCY DEPARTMENT COURSE and DIFFERENTIAL DIAGNOSIS/MDM:   Vitals:    Vitals:    12/11/19 0931   BP: (!) 141/110   Pulse: 80   Resp: 17   Temp: 98.5 ??F (36.9 ??C)   SpO2: 94%           MDM      REASSESSMENT      Discussed options for alcohol cessation at length. Provided resources in area for detox, rehab and AA meetings. Will provide a short course of librium after discussing risks/benefits and need to stay away from alcohol while taking it.    CRITICAL CARE TIME       CONSULTS:  None    PROCEDURES:  Unless otherwise noted below, none     Procedures         FINAL IMPRESSION      1. Alcohol withdrawal syndrome with complication (HCC)    2. Elevated liver function tests          DISPOSITION/PLAN   DISPOSITION  12/11/2019 10:35:19 AM      PATIENT REFERRED TO:  Parks Ranger, MD  [address]  [place]  [address]  [phone]    Call         DISCHARGE MEDICATIONS:  New Prescriptions    No medications on file     Controlled Substances Monitoring:     No flowsheet data found.    (Please note that portions of this note were completed with a voice recognition program.  Efforts were made to edit the dictations but occasionally words are mis-transcribed.)    Rache Klimaszewski Jacklynn Bue, APRN - CNP (electronically signed)  Attending Emergency Physician         Tyson Alias, APRN - CNP  12/11/19 1255

## 2019-12-22 ENCOUNTER — Ambulatory Visit
Admit: 2019-12-22 | Discharge: 2019-12-22 | Payer: BLUE CROSS/BLUE SHIELD | Attending: Family Medicine | Primary: Family Medicine

## 2019-12-22 DIAGNOSIS — I10 Essential (primary) hypertension: Secondary | ICD-10-CM

## 2019-12-22 LAB — COMPREHENSIVE METABOLIC PANEL
ALT: 142 U/L — ABNORMAL HIGH (ref 5–41)
AST: 88 U/L — ABNORMAL HIGH (ref 5–40)
Albumin: 4.4 g/dL (ref 3.5–5.2)
Alkaline Phosphatase: 73 U/L (ref 40–130)
Anion Gap: 11 mmol/L (ref 7–19)
BUN: 12 mg/dL (ref 6–20)
CO2: 26 mmol/L (ref 22–29)
Calcium: 10 mg/dL (ref 8.6–10.0)
Chloride: 102 mmol/L (ref 98–111)
Creatinine: 0.9 mg/dL (ref 0.5–1.2)
GFR African American: >59 (ref >59)
GFR Non-African American: >60 (ref >60)
Glucose: 78 mg/dL (ref 74–109)
Potassium: 4.7 mmol/L (ref 3.5–5.0)
Sodium: 139 mmol/L (ref 136–145)
Total Bilirubin: 1 mg/dL (ref 0.2–1.2)
Total Protein: 7.5 g/dL (ref 6.6–8.7)

## 2019-12-22 MED ORDER — METOPROLOL SUCCINATE ER 25 MG PO TB24
25 MG | ORAL_TABLET | Freq: Every day | ORAL | 3 refills | Status: AC
Start: 2019-12-22 — End: ?

## 2019-12-22 MED ORDER — OMEPRAZOLE 20 MG PO CPDR
20 MG | ORAL_CAPSULE | Freq: Every day | ORAL | 1 refills | Status: DC
Start: 2019-12-22 — End: 2020-03-10

## 2019-12-22 NOTE — Patient Instructions (Addendum)
Start Toprol-XL 25 mg.  Monitor blood pressures.  If blood pressures are running higher than 140/100 in a week to 2 weeks please call me and I will increase your dose on your Toprol before you see me back for your 4-week follow-up.    Labs were drawn today and we will notify you of the results of those once we receive them back.

## 2019-12-22 NOTE — Progress Notes (Signed)
SUBJECTIVE:    Patient ID: Vincent Thornton is a 44 y.o. male.    HPI:      Patient is seen today to establish care.  He was seen several years ago but has not been seen in a long time.  He was seen on July 1 for alcohol withdrawal in the ER.  When he was seen there he was found to have an elevated blood pressure.  He states they did not start him on anything for blood pressure.  They did put him on Librium for the alcohol withdrawal.  He states that he was drinking daily and was drinking whiskey.  He states that he has quit drinking and he states that after about the third or fourth day that he did start feeling a little better.  He has not had any nausea or vomiting since day 3 of not drinking.  He is currently doing an AA program and does have a good sponsor.  He states that he does have a good family support system at home as well.  He states that he does have a history of reflux.  He is currently taking omeprazole over-the-counter is wanting to know if he can get a prescription for this as it does work well for him.  He denies any blood in his stool or vomiting.  He states that he has not had any unintentional weight loss.  He denies any fevers or chills.    Past Medical History:   Diagnosis Date   . Anxiety      Current Outpatient Medications on File Prior to Visit   Medication Sig Dispense Refill   . fluticasone (FLONASE) 50 MCG/ACT nasal spray 1 spray by Each Nostril route daily (Patient not taking: Reported on 12/22/2019) 1 Bottle 0     No current facility-administered medications on file prior to visit.     No Known Allergies  Past Surgical History:   Procedure Laterality Date   . ANKLE SURGERY Bilateral      Family History   Problem Relation Age of Onset   . Heart Attack Father    . Cancer Father    . Cancer Paternal Grandmother    . Heart Disease Paternal Uncle      Social History     Socioeconomic History   . Marital status: Divorced     Spouse name: Not on file   . Number of children: Not on file   .  Years of education: Not on file   . Highest education level: Not on file   Occupational History   . Occupation: disabled   Tobacco Use   . Smoking status: Former Smoker     Packs/day: 1.00     Quit date: 06/25/2011     Years since quitting: 8.4   . Smokeless tobacco: Current User   Substance and Sexual Activity   . Alcohol use: Yes     Comment: occ   . Drug use: Not on file   . Sexual activity: Not on file   Other Topics Concern   . Not on file   Social History Narrative   . Not on file     Social Determinants of Health     Financial Resource Strain:    . Difficulty of Paying Living Expenses:    Food Insecurity:    . Worried About Programme researcher, broadcasting/film/video in the Last Year:    . [employer] of Food in the Last Year:  Transportation Needs:    . Freight forwarder (Medical):    Marland Kitchen Lack of Transportation (Non-Medical):    Physical Activity:    . Days of Exercise per Week:    . Minutes of Exercise per Session:    Stress:    . Feeling of Stress :    Social Connections:    . Frequency of Communication with Friends and Family:    . Frequency of Social Gatherings with Friends and Family:    . Attends Religious Services:    . Active Member of Clubs or Organizations:    . Attends Banker Meetings:    Marland Kitchen Marital Status:    Intimate Partner Violence:    . Fear of Current or Ex-Partner:    . Emotionally Abused:    Marland Kitchen Physically Abused:    . Sexually Abused:        Review of Systems   Constitutional: Negative for activity change, appetite change and fatigue.   HENT: Negative for congestion and rhinorrhea.    Eyes: Negative for visual disturbance.   Respiratory: Negative for cough.    Cardiovascular: Negative for chest pain and palpitations.   Gastrointestinal: Negative for abdominal pain, constipation, diarrhea, nausea and vomiting.   Genitourinary: Negative for decreased urine volume and difficulty urinating.   Musculoskeletal: Negative for arthralgias.   Skin: Negative for color change and rash.   Allergic/Immunologic:  Negative for immunocompromised state.   Neurological: Negative for seizures and headaches.   Hematological: Does not bruise/bleed easily.   Psychiatric/Behavioral: Negative for agitation and sleep disturbance.       OBJECTIVE:    Physical Exam  Constitutional:       General: He is not in acute distress.     Appearance: He is well-developed. He is not diaphoretic.   HENT:      Head: Normocephalic and atraumatic.   Neck:      Thyroid: No thyromegaly.   Cardiovascular:      Rate and Rhythm: Normal rate and regular rhythm.      Heart sounds: Normal heart sounds.   Pulmonary:      Effort: Pulmonary effort is normal. No respiratory distress.      Breath sounds: Normal breath sounds. No wheezing.   Abdominal:      General: Bowel sounds are normal.      Palpations: Abdomen is soft.      Tenderness: There is no abdominal tenderness.   Musculoskeletal:      Cervical back: Normal range of motion and neck supple.   Lymphadenopathy:      Cervical: No cervical adenopathy.   Skin:     General: Skin is warm and dry.      Findings: No rash.   Neurological:      Mental Status: He is alert and oriented to person, place, and time.   Psychiatric:         Behavior: Behavior normal.         Thought Content: Thought content normal.         Judgment: Judgment normal.        BP (!) 130/100 (Site: Left Upper Arm, Position: Sitting, Cuff Size: Large Adult)   Pulse 84   Temp 97.5 F (36.4 C)   Ht 5\' 10"  (1.778 m)   Wt 239 lb (108.4 kg)   SpO2 97%   BMI 34.29 kg/m      ASSESSMENT/PLAN:    Vincent Thornton was seen today for establish care.    Diagnoses  and all orders for this visit:    Essential hypertension  -     Comprehensive Metabolic Panel    Alcohol abuse    Gastroesophageal reflux disease, unspecified whether esophagitis present    Other orders  -     metoprolol succinate (TOPROL XL) 25 MG extended release tablet; Take 1 tablet by mouth daily  -     omeprazole (PRILOSEC) 20 MG delayed release capsule; Take 1 capsule by mouth every morning  (before breakfast)      Problem List     None        Start Toprol-XL 25 mg.  Monitor blood pressures.  If blood pressures are running higher than 140/100 in a week to 2 weeks please call me and I will increase your dose on your Toprol before you see me back for your 4-week follow-up.  Omeprazole was sent to the pharmacy for GERD.    Labs were drawn today and we will notify you of the results of those once we receive them back.    Lab Review   Admission on 12/11/2019, Discharged on 12/11/2019   Component Date Value   . WBC 12/11/2019 3.4*   . RBC 12/11/2019 4.83    . Hemoglobin 12/11/2019 16.3    . Hematocrit 12/11/2019 47.4    . MCV 12/11/2019 98.1*   . MCH 12/11/2019 33.7*   . MCHC 12/11/2019 34.4    . RDW 12/11/2019 12.8    . Platelets 12/11/2019 128*   . MPV 12/11/2019 10.2    . Neutrophils % 12/11/2019 66.6*   . Lymphocytes % 12/11/2019 16.4*   . Monocytes % 12/11/2019 15.8*   . Eosinophils % 12/11/2019 0.0    . Basophils % 12/11/2019 0.9    . Neutrophils Absolute 12/11/2019 2.3    . Immature Granulocytes # 12/11/2019 0.0    . Lymphocytes Absolute 12/11/2019 0.6*   . Monocytes Absolute 12/11/2019 0.50    . Eosinophils Absolute 12/11/2019 0.00    . Basophils Absolute 12/11/2019 0.00    . Sodium 12/11/2019 140    . Potassium reflex Magnesi* 12/11/2019 3.5    . Chloride 12/11/2019 97*   . CO2 12/11/2019 31*   . Anion Gap 12/11/2019 12    . Glucose 12/11/2019 115*   . BUN 12/11/2019 12    . CREATININE 12/11/2019 0.7    . GFR Non-African American 12/11/2019 >60    . GFR African American 12/11/2019 >59    . Calcium 12/11/2019 9.9    . Total Protein 12/11/2019 7.6    . Albumin 12/11/2019 4.4    . Total Bilirubin 12/11/2019 2.6*   . Alkaline Phosphatase 12/11/2019 83    . ALT 12/11/2019 221*   . AST 12/11/2019 200*   . Ethanol Lvl 12/11/2019 <10    . Magnesium 12/11/2019 1.6          EMR Dragon/transcription disclaimer:  Much of this encounter note is electronictranscription/translation of spoken language to printed  texts.  The electronic translation of spoken language may be erroneous, or at times, nonsensical words or phrases may be inadvertently transcribed.  Although I havereviewed the note for such errors, some may still exist.

## 2019-12-23 ENCOUNTER — Encounter

## 2020-01-07 NOTE — Progress Notes (Signed)
The results are in Pt chart.

## 2020-01-19 ENCOUNTER — Encounter: Attending: Family Medicine | Primary: Family Medicine

## 2020-02-13 NOTE — Telephone Encounter (Signed)
Pt aware thank you

## 2020-02-13 NOTE — Telephone Encounter (Signed)
Patient is a new patient to Dr. Nada Maclachlan - he c/o BP 160/115 after taking Metoprolol - took medication again and BP is now 148/104 - please advise, [place] [facility]  - thank you

## 2020-02-13 NOTE — Telephone Encounter (Signed)
Reason for Disposition  ??? Patient wants to be seen    Answer Assessment - Initial Assessment Questions  1. BLOOD PRESSURE: "What is the blood pressure?" "Did you take at least two measurements 5 minutes apart?"      148/104 and "around that all day" for about a week    2. ONSET: "When did you take your blood pressure?"      1 hr ago    3. HOW: "How did you obtain the blood pressure?" (e.g., visiting nurse, automatic home BP monitor)      Auto cuff at home    4. HISTORY: "Do you have a history of high blood pressure?"      Yes    5. MEDICATIONS: "Are you taking any medications for blood pressure?" "Have you missed any doses recently?"      Yes and no missed doses    6. OTHER SYMPTOMS: "Do you have any symptoms?" (e.g., headache, chest pain, blurred vision, difficulty breathing, weakness)      Lips tingling, SOB, weakness    7. PREGNANCY: "Is there any chance you are pregnant?" "When was your last menstrual period?"     No    Protocols used: HIGH BLOOD PRESSURE-ADULT-OH    Received call from [facility] at [facility] with [employer] Complaint.    Brief description of triage: high blood pressure, weak, SOB, lips tingling    Triage indicates for patient to be seen in office today    Care advice provided, patient verbalizes understanding; denies any other questions or concerns; instructed to call back for any new or worsening symptoms.    Writer provided warm transfer to [facility] for appointment scheduling.    Attention Provider:  Thank you for allowing me to participate in the care of your patient.  The patient was connected to triage in response to information provided to the [facility].  Please do not respond through this encounter as the response is not directed to a shared pool.

## 2020-02-13 NOTE — Telephone Encounter (Signed)
Have him double up on his metoprolol tartrate he can take 2 pills at once or he can take 1 pill twice a day and call on Tuesday and let us know about his blood pressure

## 2020-03-08 NOTE — Telephone Encounter (Signed)
Received call from Lurena Joiner at [facility] with Red Flag Complaint.    Brief description of triage:  Patient calling for concerns for severe vomiting:  At least 40 times a day.  Voiding maybe twice daily with small amount of dark yellow urine noted this morning with void.  Has also noted blood and coffee ground material with vomiting.  And has also noted some dark, black colored stools.    Triage indicates for patient to go to ED now.  Patient refusing ED disposition due to cost.  Advised based on his symptoms and concerns for his safety that ED evaluation is recommended.  Patient still refusing ED disposition and would like to see his provider.  Advised that I would speak with provider for further assistance.    Spoke with Dr Audley Hose who recommended ED evaluation for symptoms.  Advised patient's mom and patient of Dr Audley Hose ED recommendation for urgent symptoms.  Patient still declining ED disposition. Advised to go to [facility] for evaluation of his symptoms if still refusing ED disposition.  Patient declining UCC and stating that he will go to the ED tomorrow.      Care advice provided, patient verbalizes understanding; denies any other questions or concerns; instructed to call back for any new or worsening symptoms.    Attention Provider:  Thank you for allowing me to participate in the care of your patient.  The patient was connected to triage in response to information provided to the ECC/PSC.  Please do not respond through this encounter as the response is not directed to a shared pool.            Reason for Disposition  ??? SEVERE vomiting (e.g., 6 or more times/day) (Exception: patient sounds well, is drinking liquids, does not sound dehydrated, and vomiting has lasted less than 24 hours)    Answer Assessment - Initial Assessment Questions  1. VOMITING SEVERITY: "How many times have you vomited in the past 24 hours?"      - MILD:  1 - 2 times/day     - MODERATE: 3 - 5 times/day, decreased oral  intake without significant weight loss or symptoms of dehydration     - SEVERE: 6 or more times/day, vomits everything or nearly everything, with significant weight loss, symptoms of dehydration       Unsure, every time he drinks something, over the past couple days    2. ONSET: "When did the vomiting begin?"       Two weeks, getting worse    3. FLUIDS: "What fluids or food have you vomited up today?" "Have you been able to keep any fluids down?"      Drink:  Sugar free strawberry 12 ounce bottle (came up immediately), water around 8 ounces (kept that down), unable to keep any solid food down    4. ABDOMINAL PAIN: "Are your having any abdominal pain?" If yes : "How bad is it and what does it feel like?" (e.g., crampy, dull, intermittent, constant)       No, throat pain below adam's apple after vomiting    5. DIARRHEA: "Is there any diarrhea?" If so, ask: "How many times today?"       No, constipated and stool is dark black    6. CONTACTS: "Is there anyone else in the family with the same symptoms?"       No    7. CAUSE: "What do you think is causing your vomiting?"      Unsure  8. HYDRATION STATUS: "Any signs of dehydration?" (e.g., dry mouth [not only dry lips], too weak to stand) "When did you last urinate?"      Last void:  This morning.  Dark yellow, smelled strong, small amount    9. OTHER SYMPTOMS: "Do you have any other symptoms?" (e.g., fever, headache, vertigo, vomiting blood or coffee grounds, recent head injury)      No fever, no headache, no dizziness, feeling tired and weak, seeing blood and coffee ground material, unable to quantify but says a "decent amount"    10. PREGNANCY: "Is there any chance you are pregnant?" "When was your last menstrual period?"        n/a    Protocols used: VOMITING-ADULT-OH

## 2020-03-10 ENCOUNTER — Inpatient Hospital Stay
Admit: 2020-03-10 | Discharge: 2020-03-10 | Disposition: A | Discharge status: Home / Self Care | Payer: BLUE CROSS/BLUE SHIELD | Attending: Emergency Medicine

## 2020-03-10 ENCOUNTER — Emergency Department: Admit: 2020-03-10 | Payer: BLUE CROSS/BLUE SHIELD | Primary: Family Medicine

## 2020-03-10 DIAGNOSIS — K219 Gastro-esophageal reflux disease without esophagitis: Secondary | ICD-10-CM

## 2020-03-10 LAB — COMPREHENSIVE METABOLIC PANEL W/ REFLEX TO MG FOR LOW K
ALT: 179 U/L — ABNORMAL HIGH (ref 5–41)
AST: 152 U/L — ABNORMAL HIGH (ref 5–40)
Albumin: 4.6 g/dL (ref 3.5–5.2)
Alkaline Phosphatase: 76 U/L (ref 40–130)
Anion Gap: 14 mmol/L (ref 7–19)
BUN: 9 mg/dL (ref 6–20)
CO2: 26 mmol/L (ref 22–29)
Calcium: 9.3 mg/dL (ref 8.6–10.0)
Chloride: 94 mmol/L — ABNORMAL LOW (ref 98–111)
Creatinine: 0.7 mg/dL (ref 0.5–1.2)
GFR African American: >59 (ref >59)
GFR Non-African American: >60 (ref >60)
Glucose: 101 mg/dL (ref 74–109)
Potassium reflex Magnesium: 3.4 mmol/L — ABNORMAL LOW (ref 3.5–5.0)
Sodium: 134 mmol/L — ABNORMAL LOW (ref 136–145)
Total Bilirubin: 2 mg/dL — ABNORMAL HIGH (ref 0.2–1.2)
Total Protein: 7.5 g/dL (ref 6.6–8.7)

## 2020-03-10 LAB — CBC WITH AUTO DIFFERENTIAL
Basophils %: 0.5 % (ref 0.0–1.0)
Basophils Absolute: 0 10*3/uL (ref 0.00–0.20)
Eosinophils %: 0 % (ref 0.0–5.0)
Eosinophils Absolute: 0 10*3/uL (ref 0.00–0.60)
Hematocrit: 44.3 % (ref 42.0–52.0)
Hemoglobin: 15.3 g/dL (ref 14.0–18.0)
Immature Granulocytes #: 0 10*3/uL
Lymphocytes %: 27.4 % (ref 20.0–40.0)
Lymphocytes Absolute: 1.1 10*3/uL (ref 1.1–4.5)
MCH: 33.4 pg — ABNORMAL HIGH (ref 27.0–31.0)
MCHC: 34.5 g/dL (ref 33.0–37.0)
MCV: 96.7 fL — ABNORMAL HIGH (ref 80.0–94.0)
MPV: 10 fL (ref 9.4–12.4)
Monocytes %: 16.4 % — ABNORMAL HIGH (ref 0.0–10.0)
Monocytes Absolute: 0.6 10*3/uL (ref 0.00–0.90)
Neutrophils %: 55.4 % (ref 50.0–65.0)
Neutrophils Absolute: 2.1 10*3/uL (ref 1.5–7.5)
Platelets: 111 10*3/uL — ABNORMAL LOW (ref 130–400)
RBC: 4.58 M/uL — ABNORMAL LOW (ref 4.70–6.10)
RDW: 12.7 % (ref 11.5–14.5)
WBC: 3.8 10*3/uL — ABNORMAL LOW (ref 4.8–10.8)

## 2020-03-10 LAB — URINALYSIS WITH REFLEX TO CULTURE
Bilirubin Urine: NEGATIVE
Blood, Urine: NEGATIVE
Glucose, Ur: NEGATIVE mg/dL
Ketones, Urine: 15 mg/dL — AB
Leukocyte Esterase, Urine: NEGATIVE
Nitrite, Urine: NEGATIVE
Protein, UA: NEGATIVE mg/dL
Specific Gravity, UA: >=1.045 (ref 1.005–1.030)
Urobilinogen, Urine: 4 E.U./dL — AB (ref <2.0)
pH, UA: 8.5 — AB (ref 5.0–8.0)

## 2020-03-10 LAB — MAGNESIUM: Magnesium: 1.5 mg/dL — ABNORMAL LOW (ref 1.6–2.6)

## 2020-03-10 LAB — HEPATITIS PANEL, ACUTE
Hep A IgM: NONREACTIVE
Hep B Core Ab, IgM: NONREACTIVE
Hep B S Ag Interp: NONREACTIVE
Hep C Ab Interp: REACTIVE — AB

## 2020-03-10 LAB — COVID-19, RAPID: SARS-CoV-2, NAAT: NOT DETECTED

## 2020-03-10 MED ORDER — IOPAMIDOL 76 % IV SOLN
76 % | Freq: Once | INTRAVENOUS | Status: AC | PRN
Start: 2020-03-10 — End: 2020-03-10
  Administered 2020-03-10: 13:00:00 90 mL via INTRAVENOUS

## 2020-03-10 MED ORDER — LIDOCAINE VISCOUS HCL 2 % MT SOLN
2 % | Freq: Once | OROMUCOSAL | Status: AC
Start: 2020-03-10 — End: 2020-03-10
  Administered 2020-03-10: 14:00:00 via ORAL

## 2020-03-10 MED ORDER — POTASSIUM CHLORIDE 10 MEQ/100ML IV SOLN
10 MEQ/0ML | Freq: Once | INTRAVENOUS | Status: AC
Start: 2020-03-10 — End: 2020-03-10
  Administered 2020-03-10: 14:00:00 20 meq via INTRAVENOUS

## 2020-03-10 MED ORDER — SUCRALFATE 1 G PO TABS
1 GM | ORAL_TABLET | Freq: Three times a day (TID) | ORAL | 0 refills | Status: DC
Start: 2020-03-10 — End: 2020-05-17

## 2020-03-10 MED ORDER — ONDANSETRON HCL 4 MG/2ML IJ SOLN
4 MG/2ML | Freq: Once | INTRAMUSCULAR | Status: DC
Start: 2020-03-10 — End: 2020-03-10

## 2020-03-10 MED ORDER — ONDANSETRON HCL 4 MG/2ML IJ SOLN
4 MG/2ML | Freq: Once | INTRAMUSCULAR | Status: AC
Start: 2020-03-10 — End: 2020-03-10
  Administered 2020-03-10: 14:00:00 4 mg via INTRAVENOUS

## 2020-03-10 MED ORDER — MAGNESIUM SULFATE 2000 MG/50 ML IVPB PREMIX
2 GM/50ML | Freq: Once | INTRAVENOUS | Status: AC
Start: 2020-03-10 — End: 2020-03-10
  Administered 2020-03-10: 13:00:00 2000 mg via INTRAVENOUS

## 2020-03-10 MED ORDER — ONDANSETRON 4 MG PO TBDP
4 MG | ORAL_TABLET | Freq: Three times a day (TID) | ORAL | 0 refills | Status: AC | PRN
Start: 2020-03-10 — End: ?

## 2020-03-10 MED ORDER — PANTOPRAZOLE SODIUM 40 MG PO TBEC
40 MG | ORAL_TABLET | Freq: Two times a day (BID) | ORAL | 0 refills | Status: AC
Start: 2020-03-10 — End: ?

## 2020-03-10 MED ORDER — LACTATED RINGERS IV SOLN
Freq: Once | INTRAVENOUS | Status: AC
Start: 2020-03-10 — End: 2020-03-10
  Administered 2020-03-10: 12:00:00 1000 mL via INTRAVENOUS

## 2020-03-10 MED FILL — MAG-AL PLUS 200-200-20 MG/5ML PO LIQD: 200-200-20 MG/5ML | ORAL | Qty: 30

## 2020-03-10 MED FILL — ONDANSETRON HCL 4 MG/2ML IJ SOLN: 4 MG/2ML | INTRAMUSCULAR | Qty: 2

## 2020-03-10 MED FILL — MAGNESIUM SULFATE 2 GM/50ML IV SOLN: 2 GM/50ML | INTRAVENOUS | Qty: 50

## 2020-03-10 MED FILL — POTASSIUM CHLORIDE 10 MEQ/100ML IV SOLN: 10 MEQ/0ML | INTRAVENOUS | Qty: 200

## 2020-03-10 NOTE — Telephone Encounter (Signed)
Reason for Disposition  ??? Requesting regular office appointment    Answer Assessment - Initial Assessment Questions  1. REASON FOR CALL or QUESTION: "What is your reason for calling today?" or "How can I best help you?" or "What question do you have that I can help answer?"      Mom Vincent Thornton is on phone, pt is with her. Pt was in ED today for vomiting and vaccine reaction.  Calling to schedule ED f/u.  Denies worsening sx.  Advised to call back if has new or worsening sx.  Warm transfer to [facility] to schedule for ED f/u.    Protocols used: INFORMATION ONLY CALL - NO TRIAGE-ADULT-AH

## 2020-03-10 NOTE — ED Provider Notes (Signed)
[facility]  EMERGENCY DEPARTMENT ENCOUNTER      Pt Name: Vincent Thornton  MRN: [id number]  Birthdate [birth year]  Date of evaluation: 03/10/2020  Provider: Achilles Dunk, MD    CHIEF COMPLAINT       Chief Complaint   Patient presents with   ??? Emesis   ??? Nausea   ??? Dysuria         HISTORY OF PRESENT ILLNESS   (Location/Symptom, Timing/Onset,Context/Setting, Quality, Duration, Modifying Factors, Severity)  Note limiting factors.   Vincent Thornton is a 44 y.o. male who presents to the emergency department with complaint of nausea, vomiting, generalized malaise, fatigue, and weakness over the last 3 weeks and gradually worsening.  Feels dehydrated.  States his urine is very dark and malodorous.  Has not had any fevers.  Does have phlegm and sore throat with mild cough and congestion.  Has not been tested for Covid.  Denies any fevers.  Has some mild generalized abdominal discomfort, worse in the lower abdomen but denies any focal or significant abdominal pain.    HPI    NursingNotes were reviewed.    REVIEW OF SYSTEMS    (2-9 systems for level 4, 10 or more for level 5)     Review of Systems   Constitutional: Positive for fatigue. Negative for fever.   HENT: Positive for congestion. Negative for voice change.    Eyes: Negative for pain and redness.   Respiratory: Negative for shortness of breath.    Cardiovascular: Negative for chest pain.   Gastrointestinal: Positive for constipation, nausea and vomiting. Negative for diarrhea.   Endocrine: Negative.    Musculoskeletal: Negative for arthralgias and gait problem.   Skin: Negative for rash and wound.   Neurological: Negative for weakness and headaches.   Hematological: Negative.    Psychiatric/Behavioral: Negative.    All other systems reviewed and are negative.      A complete review of systems was performed and is negative except as noted above in the HPI.       PAST MEDICAL HISTORY     Past Medical History:   Diagnosis Date   ??? Anxiety    ??? Hypertension           SURGICAL HISTORY       Past Surgical History:   Procedure Laterality Date   ??? ANKLE SURGERY Bilateral    ??? WRIST SURGERY           CURRENT MEDICATIONS       Discharge Medication List as of 03/10/2020 12:49 PM      CONTINUE these medications which have NOT CHANGED    Details   metoprolol succinate (TOPROL XL) 25 MG extended release tablet Take 1 tablet by mouth daily, Disp-30 tablet, R-3Normal      fluticasone (FLONASE) 50 MCG/ACT nasal spray 1 spray by Each Nostril route daily, Disp-1 Bottle, R-0Normal             ALLERGIES     Patient has no known allergies.    FAMILY HISTORY       Family History   Problem Relation Age of Onset   ??? Heart Attack Father    ??? Cancer Father    ??? Cancer Paternal Grandmother    ??? Heart Disease Paternal Uncle           SOCIAL HISTORY       Social History     Socioeconomic History   ??? Marital status: Divorced  Spouse name: Not on file   ??? Number of children: Not on file   ??? Years of education: Not on file   ??? Highest education level: Not on file   Occupational History   ??? Occupation: disabled   Tobacco Use   ??? Smoking status: Former Smoker     Packs/day: 1.00     Quit date: 06/25/2011     Years since quitting: 8.7   ??? Smokeless tobacco: Current User   Substance and Sexual Activity   ??? Alcohol use: Yes     Comment: occ   ??? Drug use: Not Currently   ??? Sexual activity: Not on file   Other Topics Concern   ??? Not on file   Social History Narrative   ??? Not on file     Social Determinants of Health     Financial Resource Strain:    ??? Difficulty of Paying Living Expenses:    Food Insecurity:    ??? Worried About Programme researcher, broadcasting/film/video in the Last Year:    ??? Barista in the Last Year:    Transportation Needs:    ??? Freight forwarder (Medical):    ??? Lack of Transportation (Non-Medical):    Physical Activity:    ??? Days of Exercise per Week:    ??? Minutes of Exercise per Session:    Stress:    ??? Feeling of Stress :    Social Connections:    ??? Frequency of Communication with Friends and  Family:    ??? Frequency of Social Gatherings with Friends and Family:    ??? Attends Religious Services:    ??? Database administrator or Organizations:    ??? Attends Engineer, structural:    ??? Marital Status:    Intimate Programme researcher, broadcasting/film/video Violence:    ??? Fear of Current or Ex-Partner:    ??? Emotionally Abused:    ??? Physically Abused:    ??? Sexually Abused:        SCREENINGS             PHYSICAL EXAM    (up to 7 for level 4, 8 or more for level 5)     ED Triage Vitals [03/10/20 0749]   BP Temp Temp src Pulse Resp SpO2 Height Weight   (!) 152/108 98.3 ??F (36.8 ??C) -- 85 18 96 % 5\' 10"  (1.778 m) 220 lb (99.8 kg)       Physical Exam  Vitals and nursing note reviewed.   Constitutional:       General: He is not in acute distress.     Appearance: Normal appearance. He is well-developed. He is not diaphoretic.   HENT:      Head: Normocephalic and atraumatic.      Mouth/Throat:      Pharynx: No oropharyngeal exudate.   Eyes:      General: No scleral icterus.     Pupils: Pupils are equal, round, and reactive to light.   Neck:      Trachea: No tracheal deviation.   Cardiovascular:      Rate and Rhythm: Normal rate.      Pulses: Normal pulses.      Heart sounds: Normal heart sounds.   Pulmonary:      Effort: Pulmonary effort is normal.      Breath sounds: Normal breath sounds. No stridor. No wheezing or rhonchi.   Abdominal:      General: There is no distension.  Palpations: Abdomen is soft. Abdomen is not rigid.      Tenderness: There is no abdominal tenderness. There is no guarding.      Hernia: No hernia is present.   Musculoskeletal:         General: No deformity.      Cervical back: Normal range of motion.   Skin:     General: Skin is warm and dry.      Findings: No rash.   Neurological:      Mental Status: He is alert and oriented to person, place, and time.      Cranial Nerves: No cranial nerve deficit.      Coordination: Coordination normal.   Psychiatric:         Behavior: Behavior normal.         DIAGNOSTIC RESULTS      EKG: All EKG's are interpreted by the Emergency Department Physician who either signs or Co-signs this chart in the absence of a cardiologist.        RADIOLOGY:   Non-plain film images such as CT, Ultrasound and MRI are read by the radiologist. Plainradiographic images are visualized and preliminarily interpreted by the emergency physician with the below findings:        Interpretation per the Radiologist below, if available at the time of this note:    CT ABDOMEN PELVIS W IV CONTRAST Additional Contrast? None   Final Result   No acute abnormality of the abdomen or pelvis.   Severe fatty infiltration of the liver.   The appendix are normal. No gallstones.   A large calcification in the spleen is represent a large granuloma or   a previous area of focal infarction and subsequent calcification.   Moderate thickening of the wall of the distal esophagus adjacent and   proximal to a small hiatal hernia may represent reflux esophagitis.   This may be clinically correlated.   Signed by Dr Nila Nephew      XR CHEST PORTABLE   Final Result   Ill-defined density in the right upper lung probably   represent a prominent right first costochondral junction. However   possibility of superimposed lung nodules may not be excluded. A   follow-up examination in a moderately lordotic position may be   obtained.   No active infiltrate or pulmonary congestion.   Signed by Dr Nila Nephew            ED BEDSIDE ULTRASOUND:   Performed by ED Physician - none    LABS:  Labs Reviewed   CBC WITH AUTO DIFFERENTIAL - Abnormal; Notable for the following components:       Result Value    WBC 3.8 (*)     RBC 4.58 (*)     MCV 96.7 (*)     MCH 33.4 (*)     Platelets 111 (*)     Monocytes % 16.4 (*)     All other components within normal limits   COMPREHENSIVE METABOLIC PANEL W/ REFLEX TO MG FOR LOW K - Abnormal; Notable for the following components:    Sodium 134 (*)     Potassium reflex Magnesium 3.4 (*)     Chloride 94 (*)     Total  Bilirubin 2.0 (*)     ALT 179 (*)     AST 152 (*)     All other components within normal limits   URINE RT REFLEX TO CULTURE - Abnormal; Notable for the following components:  Ketones, Urine 15 (*)     pH, UA 8.5 (*)     Urobilinogen, Urine 4.0 (*)     All other components within normal limits   MAGNESIUM - Abnormal; Notable for the following components:    Magnesium 1.5 (*)     All other components within normal limits   HEPATITIS PANEL, ACUTE - Abnormal; Notable for the following components:    Hep C Ab Interp Reactive (*)     All other components within normal limits   COVID-19, RAPID       All other labs were within normal range or not returned as of this dictation.    Medications   lactated ringers infusion 1,000 mL (0 mLs IntraVENous Stopped 03/10/20 0929)   ondansetron (ZOFRAN) injection 4 mg (4 mg IntraVENous Given 03/10/20 0930)   magnesium sulfate 2000 mg in 50 mL IVPB premix (0 mg IntraVENous Stopped 03/10/20 1134)   potassium chloride 10 mEq/100 mL IVPB (Peripheral Line) (0 mEq IntraVENous Stopped 03/10/20 1134)   iopamidol (ISOVUE-370) 76 % injection 90 mL (90 mLs IntraVENous Given 03/10/20 0858)   aluminum & magnesium hydroxide-simethicone (MAALOX) 30 mL, lidocaine viscous hcl (XYLOCAINE) 5 mL (GI COCKTAIL) ( Oral Given 03/10/20 1023)       EMERGENCY DEPARTMENT COURSE and DIFFERENTIALDIAGNOSIS/MDM:   Vitals:    Vitals:    03/10/20 0749 03/10/20 1036   BP: (!) 152/108 138/78   Pulse: 85 82   Resp: 18 20   Temp: 98.3 ??F (36.8 ??C)    SpO2: 96% 98%   Weight: 220 lb (99.8 kg)    Height: 5\' 10"  (1.778 m)        MDM    ED Course as of Mar 10 1757   Wed Mar 10, 2020   0836 Potassium(!): 3.4 [JP]   0836 Magnesium(!): 1.5 [JP]   0840 ALT(!): 179 [JP]   0840 AST(!): 152 [JP]   0840 Bilirubin(!): 2.0 [JP]   0840 Potassium(!): 3.4 [JP]   0840 Magnesium(!): 1.5 [JP]   0913 Hep C Ab Interp(!): Reactive [JP]   1008 SARS-CoV-2, NAAT: Not Detected [JP]   1031 Patient reevaluated at this time with no significant change.  Has  not tried to eat or drink anything since arrival here.  Patient states he was told 15 years ago or so that he was positive for hepatitis C but did not believe he ever had any issues associated with it and does not regularly follow-up for it.  He was scheduled for a liver ultrasound recently but did not have it done stating he got called into work.    [JP]      ED Course User Index  [JP] Achilles DunkJohn E Pierce Jr., MD     After symptomatic treatment patient able to tolerate oral intake.  Plan for outpatient follow-up with GI.    Evaluation and work-up here revealed no signs of emergent or life-threatening pathology that would necessitate admission for further work-up or management at this time.  Patient is felt to be stable for discharge home with return precautions for worsening of the condition or development of new concerning symptoms.  Patient was encouraged to follow-up with their primary care doctor in the appropriate timeframe.  Necessary prescriptions and information have been provided for treatment at home.  Patient voices understanding and agreement with the plan.    CONSULTS:  None    PROCEDURES:  Unless otherwise notedbelow, none     Procedures      FINAL IMPRESSION  1. Non-intractable vomiting with nausea, unspecified vomiting type    2. Gastroesophageal reflux disease with esophagitis without hemorrhage    3. Hepatic steatosis    4. Chronic hepatitis C without hepatic coma [facility])          DISPOSITION/PLAN   DISPOSITION Decision To Discharge 03/10/2020 11:42:56 AM      PATIENT REFERRED TO:  [facility]  [address]  [place] [address]  [phone]    If symptoms worsen    Parks Ranger, MD  [address]  [place] [address]  [phone]    Schedule an appointment as soon as possible for a visit       [place], MD  [address] [facility] [address] [facility]  [address]  [place] [address]  [phone]    Schedule an appointment as soon as possible for a visit         DISCHARGE  MEDICATIONS:  Discharge Medication List as of 03/10/2020 12:49 PM      START taking these medications    Details   pantoprazole (PROTONIX) 40 MG tablet Take 1 tablet by mouth 2 times daily (before meals), Disp-180 tablet, R-0Normal      sucralfate (CARAFATE) 1 GM tablet Take 1 tablet by mouth 3 times daily, Disp-90 tablet, R-0Normal      ondansetron (ZOFRAN ODT) 4 MG disintegrating tablet Take 1 tablet by mouth every 8 hours as needed for Nausea or Vomiting, Disp-20 tablet, R-0Normal                (Please note that portions of this note were completed with a voice recognition program.  Efforts were made to edit the dictations butoccasionally words are mis-transcribed.)    Achilles Dunk, MD (electronically signed)  AttendingEmergency Physician          Achilles Dunk., MD  03/10/20 1758

## 2020-03-24 ENCOUNTER — Encounter: Payer: BLUE CROSS/BLUE SHIELD | Attending: Family Medicine | Primary: Family Medicine

## 2020-05-17 MED ORDER — SUCRALFATE 1 G PO TABS
1 GM | ORAL_TABLET | Freq: Three times a day (TID) | ORAL | 1 refills | Status: DC
Start: 2020-05-17 — End: 2020-07-05

## 2020-07-05 MED ORDER — SUCRALFATE 1 G PO TABS
1 GM | ORAL_TABLET | Freq: Three times a day (TID) | ORAL | 1 refills | Status: AC
Start: 2020-07-05 — End: 2020-08-04

## 2020-07-06 ENCOUNTER — Ambulatory Visit
Admit: 2020-07-06 | Discharge: 2020-07-06 | Payer: BLUE CROSS/BLUE SHIELD | Attending: Family Medicine | Primary: Family Medicine

## 2020-07-06 DIAGNOSIS — H00011 Hordeolum externum right upper eyelid: Secondary | ICD-10-CM

## 2020-07-06 MED ORDER — OFLOXACIN 0.3 % OP SOLN
0.3 % | OPHTHALMIC | 0 refills | Status: AC
Start: 2020-07-06 — End: ?

## 2020-07-06 MED ORDER — CEPHALEXIN 500 MG PO CAPS
500 MG | ORAL_CAPSULE | Freq: Three times a day (TID) | ORAL | 0 refills | Status: AC
Start: 2020-07-06 — End: 2020-07-13

## 2020-07-06 NOTE — Progress Notes (Signed)
SUBJECTIVE:    Patient ID: Vincent Thornton is a 45 y.o. male.    HPI:   Patient is seen today for complaints of a stye on his right upper eye.  He states that it has been there for about a month.  He states that after couple weeks it did go down but now has come back and is larger than it was.  He states that his vision vision does occasionally get blurry if especially if it is more swollen.  He states that it is not really tender anymore.  He states in the beginning it did hurt and it did drain some.  He states that he has not run fever and has not had chills.  He denies any pain around the eye.  He has not tried using anything in it other than some over-the-counter eyedrops.    Past Medical History:   Diagnosis Date   . Anxiety    . Hypertension       Current Outpatient Medications on File Prior to Visit   Medication Sig Dispense Refill   . sucralfate (CARAFATE) 1 GM tablet TAKE 1 TABLET BY MOUTH 3 TIMES DAILY 90 tablet 1   . pantoprazole (PROTONIX) 40 MG tablet Take 1 tablet by mouth 2 times daily (before meals) 180 tablet 0   . ondansetron (ZOFRAN ODT) 4 MG disintegrating tablet Take 1 tablet by mouth every 8 hours as needed for Nausea or Vomiting 20 tablet 0   . metoprolol succinate (TOPROL XL) 25 MG extended release tablet Take 1 tablet by mouth daily 30 tablet 3   . [DISCONTINUED] omeprazole (PRILOSEC) 20 MG delayed release capsule Take 1 capsule by mouth every morning (before breakfast) 90 capsule 1   . fluticasone (FLONASE) 50 MCG/ACT nasal spray 1 spray by Each Nostril route daily (Patient not taking: Reported on 12/22/2019) 1 Bottle 0     No current facility-administered medications on file prior to visit.     No Known Allergies    Review of Systems   Constitutional: Negative for activity change, appetite change and fatigue.   HENT: Negative for congestion and rhinorrhea.    Eyes: Negative for discharge, redness and visual disturbance.   Respiratory: Negative for cough.    Cardiovascular: Negative for  chest pain and palpitations.   Gastrointestinal: Negative for abdominal pain, constipation, diarrhea, nausea and vomiting.   Genitourinary: Negative for decreased urine volume and difficulty urinating.   Musculoskeletal: Negative for arthralgias.   Skin: Negative for color change and rash.   Allergic/Immunologic: Negative for immunocompromised state.   Neurological: Negative for seizures and headaches.   Hematological: Does not bruise/bleed easily.   Psychiatric/Behavioral: Negative for agitation and sleep disturbance.       OBJECTIVE:    Physical Exam  Constitutional:       General: He is not in acute distress.     Appearance: He is well-developed. He is not diaphoretic.   HENT:      Head: Normocephalic and atraumatic.   Eyes:     Neck:      Thyroid: No thyromegaly.   Cardiovascular:      Rate and Rhythm: Normal rate and regular rhythm.      Pulses: Normal pulses.      Heart sounds: Normal heart sounds.   Pulmonary:      Effort: Pulmonary effort is normal. No respiratory distress.      Breath sounds: Normal breath sounds. No wheezing.   Abdominal:  General: Abdomen is flat. Bowel sounds are normal.      Palpations: Abdomen is soft.      Tenderness: There is no abdominal tenderness.   Musculoskeletal:      Cervical back: Normal range of motion and neck supple.   Lymphadenopathy:      Cervical: No cervical adenopathy.   Skin:     General: Skin is warm and dry.      Findings: No rash.   Neurological:      General: No focal deficit present.      Mental Status: He is alert and oriented to person, place, and time. Mental status is at baseline.   Psychiatric:         Mood and Affect: Mood normal.         Behavior: Behavior normal.         Thought Content: Thought content normal.         Judgment: Judgment normal.        BP 130/82   Pulse 78   Temp 97.4 F (36.3 C) (Temporal)   Resp 16   Ht 5\' 10"  (1.778 m)   Wt 217 lb 12.8 oz (98.8 kg)   SpO2 98%   BMI 31.25 kg/m      ASSESSMENT/PLAN:    1. Hordeolum externum  of right upper eyelid  -     External Referral To Ophthalmology       I am putting a referral in for ophthalmology.  I have gone ahead and sent over Keflex and ofloxacin drops.  Of encouraged him to do warm compresses on this area.  Also encouraged him to wash his lash line with baby shampoo.  If his symptoms are getting significantly worse before he can get in with ophthalmology he is to let us know.    PDMP Monitoring:    Last PDMP Mark as Reviewed [facility]):  Review User Review Instant Review Result            Urine Drug Screenings (1 yr)    No resulted procedures found.       Medication Contract and Consent for Opioid Use Documents Filed      No documents found                 EMR Dragon/transcription disclaimer:  Much of this encounter note is electronic transcription/translation of spoken language toprinted texts.  The electronic translation of spoken language may be erroneous, or at times, nonsensical words or phrases may be inadvertently transcribed.  Although I have reviewed the note for such errors, some may stillexist.

## 2020-11-10 DEATH — deceased
